# Patient Record
Sex: Female | Born: 1970 | Race: White | Hispanic: No | Marital: Married | Smoking: Never smoker
Health system: Southern US, Community
[De-identification: ages and names within clinical notes are randomized; demographics above are authoritative.]

## PROBLEM LIST (undated history)

## (undated) DIAGNOSIS — Z98891 History of uterine scar from previous surgery: Secondary | ICD-10-CM

## (undated) DIAGNOSIS — C801 Malignant (primary) neoplasm, unspecified: Secondary | ICD-10-CM

## (undated) DIAGNOSIS — Z789 Other specified health status: Secondary | ICD-10-CM

---

## 2003-11-03 ENCOUNTER — Ambulatory Visit (HOSPITAL_COMMUNITY): Admission: RE | Admit: 2003-11-03 | Discharge: 2003-11-03 | Payer: Self-pay | Admitting: Obstetrics and Gynecology

## 2004-03-30 ENCOUNTER — Ambulatory Visit (HOSPITAL_COMMUNITY): Admission: RE | Admit: 2004-03-30 | Discharge: 2004-03-30 | Payer: Self-pay | Admitting: Obstetrics and Gynecology

## 2004-04-18 ENCOUNTER — Ambulatory Visit (HOSPITAL_COMMUNITY): Admission: RE | Admit: 2004-04-18 | Discharge: 2004-04-18 | Payer: Self-pay | Admitting: Obstetrics and Gynecology

## 2004-04-18 ENCOUNTER — Encounter (INDEPENDENT_AMBULATORY_CARE_PROVIDER_SITE_OTHER): Payer: Self-pay | Admitting: Specialist

## 2007-01-02 ENCOUNTER — Inpatient Hospital Stay (HOSPITAL_COMMUNITY): Admission: AD | Admit: 2007-01-02 | Discharge: 2007-01-05 | Payer: Self-pay | Admitting: Obstetrics and Gynecology

## 2010-03-18 ENCOUNTER — Encounter
Admission: RE | Admit: 2010-03-18 | Discharge: 2010-03-18 | Payer: Self-pay | Source: Home / Self Care | Attending: Obstetrics and Gynecology | Admitting: Obstetrics and Gynecology

## 2010-04-23 ENCOUNTER — Encounter: Payer: Self-pay | Admitting: Obstetrics and Gynecology

## 2010-04-24 ENCOUNTER — Encounter: Payer: Self-pay | Admitting: Obstetrics and Gynecology

## 2010-08-16 NOTE — Op Note (Signed)
Laura [name], Laura [name]                  ACCOUNT NO.:  [id number]   MEDICAL RECORD NO.:  [id number]          PATIENT TYPE:  INP   LOCATION:  9103                          FACILITY:  [facility]   PHYSICIAN:  Lenoard Aden, M.D.DATE OF BIRTH:  12-Jan-1971   DATE OF PROCEDURE:  01/02/2007  DATE OF DISCHARGE:                               OPERATIVE REPORT   PREOPERATIVE DIAGNOSIS:  Homero Fellers breech in active labor at 36 weeks.   POSTOPERATIVE DIAGNOSIS:  Homero Fellers breech in active labor at 36 weeks.   PROCEDURE:  Primary, low segment, transverse Cesarean section.   SURGEON:  Olivia Mackie, MD.   ANESTHESIA:  Spinal by Cline Crock, MD.   ASSISTANT:  Marlinda Mike, CNM.   ESTIMATED BLOOD LOSS:  Seven hundred mL.   COMPLICATIONS:  None.   DRAINS:  Foley.   COUNTS:  Correct.   DISPOSITION:  Patient taken to recovery in good condition.   PROCEDURE IN DETAIL:  Having been apprised of the risks of anesthesia,  infection, bleeding, injury to abdominal organs, need for repair and  delivery risk, immediate complications to include bowel and bladder  injury, the patient was brought to the operating room, anesthesia  administered the spinal anesthetic without complications.  Prepped and  draped in the usual sterile fashion.  A Foley catheter was placed.   After achieving adequate anesthesia, diluted Marcaine solution was  placed, a Pfannenstiel skin incision was made with a scalpel and carried  down to the fascia where she was nicked in the midline and opened  transversely with the Mayo scissors.  Rectus muscles dissected sharply  in the midline, peritoneum entered sharply, bladder blade placed.  The  distal peritoneum was cleared sharply off the lower uterine segment.  Curved hysterotomy incision made.  Atraumatic delivery from a frank  breech position using usual maneuvers and flexion of the fetal vertex.  Handed to pediatricians in attendance full-term living female, Apgars 8  and 9.  Cord blood  collected and delivered mainly intact 3-vessel cord  noted. Uterus exteriorized, curetted using lap pad.  Normal uterine  anatomy noted, normal tubes, normal ovaries.  No evidence of septum or  bicornuate uterus.   Uterus then closed in 2 running imbricating layers using 0 Monocryl  suture.  Bladder flap inspected, found to be hemostatic.  Irrigation  accomplished.  Good hemostasis noted.  Fascia closed using a 0 Monocryl  pursestring fashion.  Skin closed using staples.   The patient tolerated this procedure well and was transferred to  recovery in good condition.      Lenoard Aden, M.D.  Electronically Signed     RJT/MEDQ  D:  01/02/2007  T:  01/03/2007  Job:  [id number]

## 2010-08-19 NOTE — Op Note (Signed)
NAMEMarland Kitchen  Laura [name], Laura [name]                  ACCOUNT NO.:  [id number]   MEDICAL RECORD NO.:  [id number]          PATIENT TYPE:  AMB   LOCATION:  SDC                           [facility]ILITY:  [facility]   PHYSICIAN:  Lenoard Aden, M.D.DATE OF BIRTH:  31-Aug-1970   DATE OF PROCEDURE:  04/18/2004  DATE OF DISCHARGE:                                 OPERATIVE REPORT   PREOPERATIVE DIAGNOSIS:  Missed abortion at 11 weeks.   POSTOPERATIVE DIAGNOSIS:  Missed abortion at 11 weeks.   PROCEDURE:  Suction D&E.   SURGEON:  Lenoard Aden, M.D.   ANESTHESIA:  General anesthesia.   ESTIMATED BLOOD LOSS:  Less than 50 mL.   COMPLICATIONS:  None.   Products of conception to pathology.  The patient taken to recovery in good  condition.   DESCRIPTION OF PROCEDURE:  After being apprised of risks of anesthesia,  infection, bleeding, injury to abdominal organs with need for repair,  delayed risk and immediate complications to include bowel and bladder  injury,  the patient is brought to the operating room where she is  administered general anesthetic without complications, prepped and draped in  usual fashion, catheterized until the bladder was empty.  Examination under  anesthesia reveals a six-week anteflexed uterus.  No adnexal masses.  Uterus  sounds to 8 cm, dilated up to a #21 Pratt dilator.  A 7 mm curved suction  curette placed.  Suction initiated.  Products of conception noted.  Blunt  curettage in a four quadrant method reveals cavity to be empty.  Repeat  suction curettage confirms.  Minimal bleeding noted.  All instruments  removed.  The patient tolerated the procedure well and is returned to  recovery in good condition.     Rich   RJT/MEDQ  D:  04/18/2004  T:  04/18/2004  Job:  [id number]   cc:   Floyde Parkins

## 2010-08-19 NOTE — Discharge Summary (Signed)
[name], [name]                  ACCOUNT NO.:  [id number]   MEDICAL RECORD NO.:  [id number]          PATIENT TYPE:  INP   LOCATION:  9103                          [facility]ILITY:  [facility]   PHYSICIAN:  Lenoard Aden, M.D.DATE OF BIRTH:  1971/03/07   DATE OF ADMISSION:  01/02/2007  DATE OF DISCHARGE:  01/05/2007                               DISCHARGE SUMMARY   The patient underwent an uncomplicated primary C section for breech and  active labor. Postoperative course uncomplicated. Tolerated it well.  Discharged to home day 3. Discharge teaching done. Tylox and prenatal  vitamins given. Followup in the office in 4-6 weeks.      Lenoard Aden, M.D.  Electronically Signed     RJT/MEDQ  D:  01/30/2007  T:  01/30/2007  Job:  [id number]

## 2011-01-12 LAB — CBC
HCT: 28.7 — ABNORMAL LOW
HCT: 32.7 — ABNORMAL LOW
Hemoglobin: 11.3 — ABNORMAL LOW
Hemoglobin: 9.7 — ABNORMAL LOW
MCHC: 34
MCHC: 34.5
MCV: 80
MCV: 81.4
Platelets: 290
Platelets: 335
RBC: 3.53 — ABNORMAL LOW
RBC: 4.09
RDW: 14.3 — ABNORMAL HIGH
RDW: 14.4 — ABNORMAL HIGH
WBC: 10.4
WBC: 14.2 — ABNORMAL HIGH

## 2011-01-12 LAB — SYPHILIS: RPR W/REFLEX TO RPR TITER AND TREPONEMAL ANTIBODIES, TRADITIONAL SCREENING AND DIAGNOSIS ALGORITHM: RPR Ser Ql: NONREACTIVE

## 2011-03-21 ENCOUNTER — Other Ambulatory Visit: Payer: Self-pay | Admitting: Obstetrics and Gynecology

## 2011-03-21 DIAGNOSIS — Z1231 Encounter for screening mammogram for malignant neoplasm of breast: Secondary | ICD-10-CM

## 2011-04-24 ENCOUNTER — Ambulatory Visit
Admission: RE | Admit: 2011-04-24 | Discharge: 2011-04-24 | Disposition: A | Discharge status: Home / Self Care | Payer: BC Managed Care – PPO | Source: Ambulatory Visit | Attending: Obstetrics and Gynecology | Admitting: Obstetrics and Gynecology

## 2011-04-24 DIAGNOSIS — Z1231 Encounter for screening mammogram for malignant neoplasm of breast: Secondary | ICD-10-CM

## 2012-12-05 ENCOUNTER — Other Ambulatory Visit: Payer: Self-pay

## 2012-12-17 ENCOUNTER — Encounter (HOSPITAL_COMMUNITY): Payer: Self-pay | Admitting: Obstetrics and Gynecology

## 2012-12-24 ENCOUNTER — Encounter (HOSPITAL_COMMUNITY): Payer: Self-pay

## 2012-12-24 ENCOUNTER — Ambulatory Visit (HOSPITAL_COMMUNITY)
Admission: RE | Admit: 2012-12-24 | Discharge: 2012-12-24 | Disposition: A | Discharge status: Home / Self Care | Payer: BC Managed Care – PPO | Source: Ambulatory Visit | Attending: Obstetrics and Gynecology | Admitting: Obstetrics and Gynecology

## 2012-12-24 DIAGNOSIS — O351XX0 Maternal care for (suspected) chromosomal abnormality in fetus, not applicable or unspecified: Secondary | ICD-10-CM | POA: Insufficient documentation

## 2012-12-24 NOTE — Progress Notes (Signed)
Genetic Counseling  High-Risk Gestation Note  Appointment Date:  12/24/2012 Referred By: Lenoard Aden, MD Date of Birth:  [birth year] Partner:  Laura [name]    Pregnancy History: [id number] Estimated Date of Delivery: 06/14/13 Estimated Gestational Age: [redacted]w[redacted]d Attending: Alpha Gula, MD   [name] and her husband, Mr. Laura [name], were seen for genetic counseling because of an increased risk for fetal trisomy X syndrome from noninvasive prenatal screening (NIPS)/cell free DNA testing from [place] [facility]) facilitated through her OB office.  The results of the noninvasive prenatal screening (NIPS) revealed a >99% chance of fetal 59, XXX (also known as triple X syndrome or trisomy X syndrome). We discussed that this testing is reported to have a false positive rate of less than 1% for sex chromosome aneuploidy. In addition, we reviewed that NIPS indicated a low risk for trisomy 60, trisomy 40, and trisomy 13 (less than 1 in 10,000 for each). This testing identifies > 99% of pregnancies with trisomy 21, >98% of pregnancies with trisomy 17, and >80% with trisomy 59. We reviewed that this testing is highly sensitive and specific, but is not considered diagnostic. We reviewed this tests limitations and emphasized its utility as a screening test only.  We reviewed that the cell free DNA test can not distinguish between aneuploidy confined to the placenta, trisomy X in the fetus, mosaic trisomy X, or an underlying maternal X chromosome aneuploidy.   They were counseled regarding maternal age and the association with risk for chromosome conditions due to nondisjunction with aging of the ova.   We reviewed chromosomes and nondisjunction. Typically, humans have 46 chromosomes in 23 pairs. The last pair, the 23rd pair, is also referred to as the sex chromosomes. Males typically have one X and one Y chromosome, and females typically have two X chromosomes. Trisomy X, or 82, XXX, describes a female  with an additional X chromosome present.   We discussed that most females with 23, XXX will have some but not all possible features of the conditions. Physically, we discussed that females with Trisomy X syndrome may be taller than average, though this is not always the case. Subtle physical differences may also include epicanthal folds and/or clinodactyly.  Typically, trisomy X syndrome is not typically associated with major congenital malformations. However, there are reports of females with 99, XXX with birth defects, with genitourinary malformations among the most common type that occur (approximately 5-16% of cases). We discussed the increased chance for hypotonia, possibly resulting in developmental delay. Most individuals with trisomy X syndrome have intelligence in the normal or low-normal range, but there is an increased chance for learning disabilities, particularly related to speech and language skills.  An increased chance for psychiatric/behavioral disorders, epilepsy, and possibly premature ovarian failure has also been described for a subset of patients with triple X syndrome. [name] was counseled that Trisomy X has an approximate incidence of 1 in 1,000. However, there is a high degree of variability seen among females who have this condition, meaning that every child with 19, XXX will not be affected in exactly the same way, and some children will have more or less features than others. It is estimated that only 10% of individuals with Trisomy X are diagnosed given in part to many individuals being mildly affected or asymptomatic.   We discussed that due to the higher chance for developmental delays, early intervention programs may be helpful for children with Trisomy X syndrome. Early intervention services such  as physical, occupational, and speech therapies can help with achievement of developmental milestones. These therapies are typically provided to children (with qualifying  diagnoses) from birth until age 53.  Given the variability of the symptoms associated with Trisomy X syndrome, specific outcomes for each individual cannot be predicted. We discussed national support organizations and provided the couple with written information regarding chromosomes and Trisomy X. Additionally, we discussed that postnatal health management can be coordinated by a medical geneticist, in the case that 88, XXX is diagnosed. [name] was encouraged to contact us if they have any additional questions/concerns or if they need additional supportive or informational recommendations.   We discussed the availability of amniocentesis or postnatal peripheral blood chromosome analysis for confirmation of the diagnosis. We discussed the risks, benefits, and limitations of amniocentesis, including the approximate 1 in 300-500 risk of complications, including spontaneous pregnancy loss. We also discussed the option of targeted ultrasound in the second trimester to assess fetal growth and anatomy in detail.  After careful consideration, [name] declined amniocentesis in the pregnancy, given the associated risk of complications in pregnancy. They also indicated that they would not plan to alter the course of their pregnancy based on the presence of 51, XXX. The couple stated that they would prefer to pursue postnatal chromosome studies to assess for the presence or absence of Trisomy X.  [name] stated that she will likely plan to have targeted ultrasound through her primary OB office. If the couple and/or her OB desire for this to be performed at our office, please contact our office to facilitate. They understand that targeted ultrasound and screening do not diagnose or rule out all birth defects or genetic conditions.   [name] was provided with written information regarding cystic fibrosis (CF) including the carrier frequency and incidence in the Caucasian population, the  availability of carrier testing and prenatal diagnosis if indicated.  In addition, we discussed that CF is routinely screened for as part of the [facility] newborn screening panel.  She declined testing today.   Both family histories were reviewed and found to be noncontributory for birth defects, intellectual disability, and known genetic conditions. Without further information regarding the provided family history, an accurate genetic risk cannot be calculated. Further genetic counseling is warranted if more information is obtained. The father of the pregnancy reported that he is 43 years old. This couple was counseled that advanced paternal age (APA) is defined as paternal age greater than or equal to age 75.  Recent large-scale sequencing studies have shown that approximately 80% of de novo point mutations are of paternal origin.  Many studies have demonstrated a strong correlation between increased paternal age and de novo point mutations.  Although no specific data is available regarding fetal risks for fathers 74+ years old at conception, it is apparent that the overall risk for single gene conditions is increased.  To estimate the relative increase in risk of a genetic disorder with APA, the heritability of the disease must be considered.  Assuming an approximate 2x increase in risk for conditions that are exclusively paternal in origin, the risk for each individual condition is still relatively low.  It is estimated that the overall chance for a de novo mutation is ~0.5%.  We also discussed the wide range of conditions which can be caused by new dominant gene mutations (achondroplasia, neurofibromatosis, Marfan syndrome etc.).  They were counseled that genetic testing for each individual single gene condition  is not warranted or available unless ultrasound or family history concerns lend suspicion to a specific condition. We reviewed the recommendation for a detailed ultrasound at approximately 18+ weeks  gestation and a follow up ultrasound at ~28 weeks to monitor fetal growth.  [name] denied exposure to environmental toxins or chemical agents. She denied the use of alcohol, tobacco or street drugs. She denied significant viral illnesses during the course of her pregnancy. Her medical and surgical histories were noncontributory.   I counseled this couple regarding the above risks and available options.  The approximate face-to-face time with the genetic counselor was 40 minutes.    Quinn Plowman, MS Certified Genetic Counselor 12/24/2012

## 2013-05-14 ENCOUNTER — Other Ambulatory Visit: Payer: Self-pay | Admitting: Obstetrics and Gynecology

## 2013-05-20 ENCOUNTER — Inpatient Hospital Stay (HOSPITAL_COMMUNITY)
Admission: AD | Admit: 2013-05-20 | Discharge: 2013-05-23 | DRG: 765 | Disposition: A | Discharge status: Home / Self Care | Payer: BC Managed Care – PPO | Source: Ambulatory Visit | Attending: Obstetrics and Gynecology | Admitting: Obstetrics and Gynecology

## 2013-05-20 ENCOUNTER — Inpatient Hospital Stay (HOSPITAL_COMMUNITY): Payer: BC Managed Care – PPO | Admitting: Anesthesiology

## 2013-05-20 ENCOUNTER — Encounter (HOSPITAL_COMMUNITY): Payer: Self-pay

## 2013-05-20 ENCOUNTER — Encounter (HOSPITAL_COMMUNITY)
Admission: AD | Disposition: A | Discharge status: Home / Self Care | Payer: Self-pay | Source: Ambulatory Visit | Attending: Obstetrics and Gynecology

## 2013-05-20 ENCOUNTER — Encounter (HOSPITAL_COMMUNITY): Payer: BC Managed Care – PPO | Admitting: Anesthesiology

## 2013-05-20 DIAGNOSIS — Z6839 Body mass index (BMI) 39.0-39.9, adult: Secondary | ICD-10-CM

## 2013-05-20 DIAGNOSIS — D62 Acute posthemorrhagic anemia: Secondary | ICD-10-CM | POA: Diagnosis not present

## 2013-05-20 DIAGNOSIS — O99214 Obesity complicating childbirth: Secondary | ICD-10-CM

## 2013-05-20 DIAGNOSIS — O34219 Maternal care for unspecified type scar from previous cesarean delivery: Principal | ICD-10-CM | POA: Diagnosis present

## 2013-05-20 DIAGNOSIS — O09529 Supervision of elderly multigravida, unspecified trimester: Secondary | ICD-10-CM | POA: Diagnosis present

## 2013-05-20 DIAGNOSIS — O351XX0 Maternal care for (suspected) chromosomal abnormality in fetus, not applicable or unspecified: Secondary | ICD-10-CM

## 2013-05-20 DIAGNOSIS — O9903 Anemia complicating the puerperium: Secondary | ICD-10-CM | POA: Diagnosis not present

## 2013-05-20 DIAGNOSIS — O3510X0 Maternal care for (suspected) chromosomal abnormality in fetus, unspecified, not applicable or unspecified: Secondary | ICD-10-CM

## 2013-05-20 DIAGNOSIS — E669 Obesity, unspecified: Secondary | ICD-10-CM | POA: Diagnosis present

## 2013-05-20 DIAGNOSIS — Z98891 History of uterine scar from previous surgery: Secondary | ICD-10-CM

## 2013-05-20 HISTORY — DX: History of uterine scar from previous surgery: Z98.891

## 2013-05-20 HISTORY — DX: Other specified health status: Z78.9

## 2013-05-20 LAB — ABO/RH: ABO/RH(D): O POS

## 2013-05-20 LAB — CBC
HCT: 32.2 % — ABNORMAL LOW (ref 36.0–46.0)
HEMOGLOBIN: 10.6 g/dL — AB (ref 12.0–15.0)
MCH: 26.7 pg (ref 26.0–34.0)
MCHC: 32.9 g/dL (ref 30.0–36.0)
MCV: 81.1 fL (ref 78.0–100.0)
Platelets: 287 10*3/uL (ref 150–400)
RBC: 3.97 MIL/uL (ref 3.87–5.11)
RDW: 14.5 % (ref 11.5–15.5)
WBC: 9.8 10*3/uL (ref 4.0–10.5)

## 2013-05-20 LAB — RPR: RPR Ser Ql: NONREACTIVE

## 2013-05-20 LAB — TYPE AND SCREEN
ABO/RH(D): O POS
Antibody Screen: NEGATIVE

## 2013-05-20 LAB — POCT FERN TEST: POCT Fern Test: POSITIVE

## 2013-05-20 SURGERY — Surgical Case
Anesthesia: Spinal | Site: Abdomen

## 2013-05-20 MED ORDER — SIMETHICONE 80 MG PO CHEW
80.0000 mg | CHEWABLE_TABLET | ORAL | Status: DC
  Administered 2013-05-21 – 2013-05-22 (×3): 80 mg via ORAL
  Filled 2013-05-20 (×3): qty 1

## 2013-05-20 MED ORDER — DIBUCAINE 1 % RE OINT: 1.0000 "application " | TOPICAL_OINTMENT | RECTAL | Status: DC | PRN

## 2013-05-20 MED ORDER — DIPHENHYDRAMINE HCL 50 MG/ML IJ SOLN: 25.0000 mg | INTRAMUSCULAR | Status: DC | PRN

## 2013-05-20 MED ORDER — EPHEDRINE 5 MG/ML INJ
INTRAVENOUS | Status: AC
  Filled 2013-05-20: qty 10

## 2013-05-20 MED ORDER — WITCH HAZEL-GLYCERIN EX PADS: 1.0000 "application " | MEDICATED_PAD | CUTANEOUS | Status: DC | PRN

## 2013-05-20 MED ORDER — KETOROLAC TROMETHAMINE 30 MG/ML IJ SOLN
30.0000 mg | Freq: Four times a day (QID) | INTRAMUSCULAR | Status: AC | PRN
Start: 2013-05-20 — End: 2013-05-21

## 2013-05-20 MED ORDER — MORPHINE SULFATE (PF) 0.5 MG/ML IJ SOLN
INTRAMUSCULAR | Status: DC | PRN
  Administered 2013-05-20: .1 mg via INTRATHECAL

## 2013-05-20 MED ORDER — LACTATED RINGERS IV SOLN
INTRAVENOUS | Status: DC
  Administered 2013-05-20: 12:00:00 via INTRAVENOUS

## 2013-05-20 MED ORDER — OXYCODONE-ACETAMINOPHEN 5-325 MG PO TABS
1.0000 | ORAL_TABLET | ORAL | Status: DC | PRN
  Administered 2013-05-21 – 2013-05-22 (×3): 1 via ORAL
  Filled 2013-05-20 (×4): qty 1

## 2013-05-20 MED ORDER — OXYTOCIN 40 UNITS IN LACTATED RINGERS INFUSION - SIMPLE MED: 62.5000 mL/h | INTRAVENOUS | Status: AC

## 2013-05-20 MED ORDER — SCOPOLAMINE 1 MG/3DAYS TD PT72
1.0000 | MEDICATED_PATCH | Freq: Once | TRANSDERMAL | Status: AC
  Administered 2013-05-20: 1.5 mg via TRANSDERMAL

## 2013-05-20 MED ORDER — METOCLOPRAMIDE HCL 5 MG/ML IJ SOLN: 10.0000 mg | Freq: Once | INTRAMUSCULAR | Status: DC | PRN

## 2013-05-20 MED ORDER — SENNOSIDES-DOCUSATE SODIUM 8.6-50 MG PO TABS
2.0000 | ORAL_TABLET | ORAL | Status: DC
  Administered 2013-05-21 – 2013-05-22 (×2): 2 via ORAL
  Filled 2013-05-20 (×3): qty 2

## 2013-05-20 MED ORDER — MENTHOL 3 MG MT LOZG: 1.0000 | LOZENGE | OROMUCOSAL | Status: DC | PRN

## 2013-05-20 MED ORDER — NALOXONE HCL 1 MG/ML IJ SOLN
1.0000 ug/kg/h | INTRAMUSCULAR | Status: DC | PRN
  Filled 2013-05-20: qty 2

## 2013-05-20 MED ORDER — FENTANYL CITRATE 0.05 MG/ML IJ SOLN
INTRAMUSCULAR | Status: AC
  Filled 2013-05-20: qty 2

## 2013-05-20 MED ORDER — ONDANSETRON HCL 4 MG PO TABS: 4.0000 mg | ORAL_TABLET | ORAL | Status: DC | PRN

## 2013-05-20 MED ORDER — MEPERIDINE HCL 25 MG/ML IJ SOLN: 6.2500 mg | INTRAMUSCULAR | Status: DC | PRN

## 2013-05-20 MED ORDER — BUPIVACAINE HCL (PF) 0.25 % IJ SOLN
INTRAMUSCULAR | Status: DC | PRN
  Administered 2013-05-20: 10 mL

## 2013-05-20 MED ORDER — METOCLOPRAMIDE HCL 5 MG/ML IJ SOLN: 10.0000 mg | Freq: Three times a day (TID) | INTRAMUSCULAR | Status: DC | PRN

## 2013-05-20 MED ORDER — FAMOTIDINE IN NACL 20-0.9 MG/50ML-% IV SOLN
20.0000 mg | Freq: Once | INTRAVENOUS | Status: AC
  Administered 2013-05-20: 20 mg via INTRAVENOUS
  Filled 2013-05-20: qty 50

## 2013-05-20 MED ORDER — PHENYLEPHRINE 8 MG IN D5W 100 ML (0.08MG/ML) PREMIX OPTIME
INJECTION | INTRAVENOUS | Status: DC | PRN
  Administered 2013-05-20: 60 ug/min via INTRAVENOUS

## 2013-05-20 MED ORDER — SIMETHICONE 80 MG PO CHEW: 80.0000 mg | CHEWABLE_TABLET | ORAL | Status: DC | PRN

## 2013-05-20 MED ORDER — SIMETHICONE 80 MG PO CHEW
80.0000 mg | CHEWABLE_TABLET | Freq: Three times a day (TID) | ORAL | Status: DC
Start: 2013-05-20 — End: 2013-05-23
  Administered 2013-05-20 – 2013-05-23 (×9): 80 mg via ORAL
  Filled 2013-05-20 (×10): qty 1

## 2013-05-20 MED ORDER — TETANUS-DIPHTH-ACELL PERTUSSIS 5-2.5-18.5 LF-MCG/0.5 IM SUSP: 0.5000 mL | Freq: Once | INTRAMUSCULAR | Status: DC

## 2013-05-20 MED ORDER — CEFAZOLIN SODIUM-DEXTROSE 2-3 GM-% IV SOLR
INTRAVENOUS | Status: AC
  Filled 2013-05-20: qty 50

## 2013-05-20 MED ORDER — ONDANSETRON HCL 4 MG/2ML IJ SOLN: 4.0000 mg | INTRAMUSCULAR | Status: DC | PRN

## 2013-05-20 MED ORDER — NALOXONE HCL 0.4 MG/ML IJ SOLN: 0.4000 mg | INTRAMUSCULAR | Status: DC | PRN

## 2013-05-20 MED ORDER — PHENYLEPHRINE 8 MG IN D5W 100 ML (0.08MG/ML) PREMIX OPTIME
INJECTION | INTRAVENOUS | Status: AC
  Filled 2013-05-20: qty 100

## 2013-05-20 MED ORDER — BUPIVACAINE IN DEXTROSE 0.75-8.25 % IT SOLN
INTRATHECAL | Status: DC | PRN
  Administered 2013-05-20: 1.3 mL via INTRATHECAL

## 2013-05-20 MED ORDER — SCOPOLAMINE 1 MG/3DAYS TD PT72
MEDICATED_PATCH | TRANSDERMAL | Status: AC
  Filled 2013-05-20: qty 1

## 2013-05-20 MED ORDER — METHYLERGONOVINE MALEATE 0.2 MG PO TABS: 0.2000 mg | ORAL_TABLET | ORAL | Status: DC | PRN

## 2013-05-20 MED ORDER — OXYTOCIN 10 UNIT/ML IJ SOLN
40.0000 [IU] | INTRAVENOUS | Status: DC | PRN
  Administered 2013-05-20: 40 [IU] via INTRAVENOUS

## 2013-05-20 MED ORDER — KETOROLAC TROMETHAMINE 30 MG/ML IJ SOLN: 30.0000 mg | Freq: Four times a day (QID) | INTRAMUSCULAR | Status: AC | PRN

## 2013-05-20 MED ORDER — ONDANSETRON HCL 4 MG/2ML IJ SOLN: 4.0000 mg | Freq: Three times a day (TID) | INTRAMUSCULAR | Status: DC | PRN

## 2013-05-20 MED ORDER — FENTANYL CITRATE 0.05 MG/ML IJ SOLN: 25.0000 ug | INTRAMUSCULAR | Status: DC | PRN

## 2013-05-20 MED ORDER — ONDANSETRON HCL 4 MG/2ML IJ SOLN
INTRAMUSCULAR | Status: DC | PRN
  Administered 2013-05-20: 4 mg via INTRAVENOUS

## 2013-05-20 MED ORDER — NALBUPHINE HCL 10 MG/ML IJ SOLN: 5.0000 mg | INTRAMUSCULAR | Status: DC | PRN

## 2013-05-20 MED ORDER — KETOROLAC TROMETHAMINE 60 MG/2ML IM SOLN
INTRAMUSCULAR | Status: AC
  Filled 2013-05-20: qty 2

## 2013-05-20 MED ORDER — MORPHINE SULFATE 0.5 MG/ML IJ SOLN
INTRAMUSCULAR | Status: AC
  Filled 2013-05-20: qty 10

## 2013-05-20 MED ORDER — FENTANYL CITRATE 0.05 MG/ML IJ SOLN
INTRAMUSCULAR | Status: DC | PRN
  Administered 2013-05-20: 15 ug via INTRATHECAL

## 2013-05-20 MED ORDER — LANOLIN HYDROUS EX OINT: 1.0000 "application " | TOPICAL_OINTMENT | CUTANEOUS | Status: DC | PRN

## 2013-05-20 MED ORDER — METHYLERGONOVINE MALEATE 0.2 MG/ML IJ SOLN: 0.2000 mg | INTRAMUSCULAR | Status: DC | PRN

## 2013-05-20 MED ORDER — SODIUM CHLORIDE 0.9 % IJ SOLN: 3.0000 mL | INTRAMUSCULAR | Status: DC | PRN

## 2013-05-20 MED ORDER — PRENATAL MULTIVITAMIN CH
1.0000 | ORAL_TABLET | Freq: Every day | ORAL | Status: DC
  Administered 2013-05-21 – 2013-05-23 (×3): 1 via ORAL
  Filled 2013-05-20 (×3): qty 1

## 2013-05-20 MED ORDER — DIPHENHYDRAMINE HCL 25 MG PO CAPS: 25.0000 mg | ORAL_CAPSULE | ORAL | Status: DC | PRN

## 2013-05-20 MED ORDER — DIPHENHYDRAMINE HCL 25 MG PO CAPS: 25.0000 mg | ORAL_CAPSULE | Freq: Four times a day (QID) | ORAL | Status: DC | PRN

## 2013-05-20 MED ORDER — CEFAZOLIN SODIUM-DEXTROSE 2-3 GM-% IV SOLR
2.0000 g | Freq: Three times a day (TID) | INTRAVENOUS | Status: DC
  Administered 2013-05-20: 2 g via INTRAVENOUS

## 2013-05-20 MED ORDER — LACTATED RINGERS IV SOLN
INTRAVENOUS | Status: DC | PRN
  Administered 2013-05-20 (×2): via INTRAVENOUS

## 2013-05-20 MED ORDER — LACTATED RINGERS IV SOLN
INTRAVENOUS | Status: DC | PRN
  Administered 2013-05-20 (×3): via INTRAVENOUS

## 2013-05-20 MED ORDER — KETOROLAC TROMETHAMINE 60 MG/2ML IM SOLN
60.0000 mg | Freq: Once | INTRAMUSCULAR | Status: AC | PRN
  Administered 2013-05-20: 60 mg via INTRAMUSCULAR

## 2013-05-20 MED ORDER — OXYTOCIN 10 UNIT/ML IJ SOLN
INTRAMUSCULAR | Status: AC
  Filled 2013-05-20: qty 4

## 2013-05-20 MED ORDER — CITRIC ACID-SODIUM CITRATE 334-500 MG/5ML PO SOLN
30.0000 mL | Freq: Once | ORAL | Status: AC
  Administered 2013-05-20: 30 mL via ORAL
  Filled 2013-05-20: qty 15

## 2013-05-20 MED ORDER — ONDANSETRON HCL 4 MG/2ML IJ SOLN
INTRAMUSCULAR | Status: AC
  Filled 2013-05-20: qty 2

## 2013-05-20 MED ORDER — IBUPROFEN 600 MG PO TABS
600.0000 mg | ORAL_TABLET | Freq: Four times a day (QID) | ORAL | Status: DC
  Administered 2013-05-20 – 2013-05-23 (×13): 600 mg via ORAL
  Filled 2013-05-20 (×13): qty 1

## 2013-05-20 MED ORDER — ZOLPIDEM TARTRATE 5 MG PO TABS: 5.0000 mg | ORAL_TABLET | Freq: Every evening | ORAL | Status: DC | PRN

## 2013-05-20 MED ORDER — BUPIVACAINE HCL (PF) 0.25 % IJ SOLN
INTRAMUSCULAR | Status: AC
  Filled 2013-05-20: qty 30

## 2013-05-20 MED ORDER — DIPHENHYDRAMINE HCL 50 MG/ML IJ SOLN: 12.5000 mg | INTRAMUSCULAR | Status: DC | PRN

## 2013-05-20 SURGICAL SUPPLY — 34 items
CLAMP CORD UMBIL (MISCELLANEOUS) IMPLANT
CLOTH BEACON ORANGE TIMEOUT ST (SAFETY) ×3 IMPLANT
CONTAINER PREFILL 10% NBF 15ML (MISCELLANEOUS) IMPLANT
DRAPE LG THREE QUARTER DISP (DRAPES) IMPLANT
DRSG OPSITE POSTOP 4X10 (GAUZE/BANDAGES/DRESSINGS) ×3 IMPLANT
DURAPREP 26ML APPLICATOR (WOUND CARE) ×3 IMPLANT
ELECT REM PT RETURN 9FT ADLT (ELECTROSURGICAL) ×3
ELECTRODE REM PT RTRN 9FT ADLT (ELECTROSURGICAL) ×1 IMPLANT
EXTRACTOR VACUUM M CUP 4 TUBE (SUCTIONS) IMPLANT
EXTRACTOR VACUUM M CUP 4' TUBE (SUCTIONS)
GLOVE BIO SURGEON STRL SZ7.5 (GLOVE) ×3 IMPLANT
GOWN PREVENTION PLUS XLARGE (GOWN DISPOSABLE) ×3 IMPLANT
GOWN STRL REIN XL XLG (GOWN DISPOSABLE) ×3 IMPLANT
KIT ABG SYR 3ML LUER SLIP (SYRINGE) IMPLANT
NDL HYPO 25X1 1.5 SAFETY (NEEDLE) ×1 IMPLANT
NDL HYPO 25X5/8 SAFETYGLIDE (NEEDLE) IMPLANT
NEEDLE HYPO 25X1 1.5 SAFETY (NEEDLE) ×3 IMPLANT
NEEDLE HYPO 25X5/8 SAFETYGLIDE (NEEDLE) IMPLANT
NS IRRIG 1000ML POUR BTL (IV SOLUTION) ×3 IMPLANT
PACK C SECTION WH (CUSTOM PROCEDURE TRAY) ×3 IMPLANT
STAPLER VISISTAT 35W (STAPLE) IMPLANT
SUT MNCRL 0 VIOLET CTX 36 (SUTURE) ×2 IMPLANT
SUT MNCRL AB 3-0 PS2 27 (SUTURE) IMPLANT
SUT MON AB 2-0 CT1 27 (SUTURE) ×3 IMPLANT
SUT MON AB-0 CT1 36 (SUTURE) ×6 IMPLANT
SUT MONOCRYL 0 CTX 36 (SUTURE) ×4
SUT PLAIN 0 NONE (SUTURE) IMPLANT
SUT PLAIN 2 0 (SUTURE)
SUT PLAIN 2 0 XLH (SUTURE) IMPLANT
SUT PLAIN ABS 2-0 CT1 27XMFL (SUTURE) IMPLANT
SYR CONTROL 10ML LL (SYRINGE) ×3 IMPLANT
TOWEL OR 17X24 6PK STRL BLUE (TOWEL DISPOSABLE) ×3 IMPLANT
TRAY FOLEY CATH 14FR (SET/KITS/TRAYS/PACK) ×3 IMPLANT
WATER STERILE IRR 1000ML POUR (IV SOLUTION) ×3 IMPLANT

## 2013-05-20 NOTE — Progress Notes (Signed)
Patient ID: Laura [name], female   DOB: 11/25/1970, 43 y.o.   MRN: [id number] Patient seen and examined. Consent witnessed and signed. No changes noted. Update completed. CBC    Component Value Date/Time   WBC 9.8 05/20/2013 0415   RBC 3.97 05/20/2013 0415   HGB 10.6* 05/20/2013 0415   HCT 32.2* 05/20/2013 0415   PLT 287 05/20/2013 0415   MCV 81.1 05/20/2013 0415   MCH 26.7 05/20/2013 0415   MCHC 32.9 05/20/2013 0415   RDW 14.5 05/20/2013 0415

## 2013-05-20 NOTE — H&P (Signed)
Laura [name] is a 43 y.o. female presenting for SROM. Maternal Medical History:  Reason for admission: Rupture of membranes.   Contractions: Onset was less than 1 hour ago.   Frequency: rare.   Perceived severity is mild.    Fetal activity: Perceived fetal activity is normal.   Last perceived fetal movement was within the past hour.    Prenatal complications: no prenatal complications Prenatal Complications - Diabetes: none.    OB History   Grav Para Term Preterm Abortions TAB SAB Ect Mult Living   2 1 1       1      Past Medical History  Diagnosis Date  . Medical history non-contributory    Past Surgical History  Procedure Laterality Date  . Cesarean section     Family History: family history is not on file. Social History:  reports that she has never smoked. She has never used smokeless tobacco. She reports that she does not use illicit drugs. Her alcohol history is not on file.   Prenatal Transfer Tool  Maternal Diabetes: No Genetic Screening: Abnormal:  Results: Other: Maternal Ultrasounds/Referrals: Normal Fetal Ultrasounds or other Referrals:  None Maternal Substance Abuse:  No Significant Maternal Medications:  None Significant Maternal Lab Results:  None Other Comments:  NIPT c/w trisomy X  Review of Systems  All other systems reviewed and are negative.    Dilation: 4 Effacement (%): 90 Station: -2 Exam by:: Olevia Bowens, RN/Danielle Simpson, RN Blood pressure 136/82, pulse 107, temperature 98 F (36.7 C), temperature source Oral, resp. rate 18, height 5\' 1"  (1.549 m), weight 93.532 kg (206 lb 3.2 oz), unknown if currently breastfeeding. Maternal Exam:  Uterine Assessment: Contraction strength is mild.  Contraction frequency is irregular.   Abdomen: Patient reports no abdominal tenderness. Surgical scars: low transverse.   Fetal presentation: vertex  Introitus: Normal vulva. Normal vagina.  Ferning test: positive.  Nitrazine test:  positive. Amniotic fluid character: clear.  Pelvis: adequate for delivery.   Cervix: Cervix evaluated by digital exam.     Physical Exam  Nursing note and vitals reviewed. Constitutional: She is oriented to person, place, and time. She appears well-developed.  HENT:  Head: Normocephalic and atraumatic.  Cardiovascular: Normal rate and regular rhythm.   Respiratory: Effort normal and breath sounds normal.  GI: Soft. Bowel sounds are normal.  Genitourinary: Vagina normal and uterus normal.  Musculoskeletal: Normal range of motion.  Neurological: She is alert and oriented to person, place, and time.  Skin: Skin is warm and dry.    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

## 2013-05-20 NOTE — Transfer of Care (Signed)
Immediate Anesthesia Transfer of Care Note  Patient: Laura [name]  Procedure(s) Performed: Procedure(s): CESAREAN SECTION (N/A)  Patient Location: PACU  Anesthesia Type:Spinal  Level of Consciousness: awake, alert , oriented and patient cooperative  Airway & Oxygen Therapy: Patient Spontanous Breathing  Post-op Assessment: Report given to PACU RN and Post -op Vital signs reviewed and stable  Post vital signs: Reviewed and stable  Complications: No apparent anesthesia complications

## 2013-05-20 NOTE — Anesthesia Postprocedure Evaluation (Signed)
  Anesthesia Post-op Note  Patient: Laura [name]  Procedure(s) Performed: Procedure(s): CESAREAN SECTION (N/A)  Patient Location: PACU  Anesthesia Type:Spinal  Level of Consciousness: awake, alert  and oriented  Airway and Oxygen Therapy: Patient Spontanous Breathing  Post-op Pain: none  Post-op Assessment: Post-op Vital signs reviewed, Patient's Cardiovascular Status Stable, Respiratory Function Stable, Patent Airway, No signs of Nausea or vomiting and Pain level controlled  Post-op Vital Signs: Reviewed and stable  Complications: No apparent anesthesia complications

## 2013-05-20 NOTE — MAU Note (Addendum)
Dr. Ronita Hipps notified of patients desire to have a repeat C-section was told to prep patient and he would be a while to come in and see patient. Notify him if patient gets uncomfortable. Notified RN to prep pt for csec and call me if pt became uncomfortable in the interim.

## 2013-05-20 NOTE — Anesthesia Preprocedure Evaluation (Addendum)
Anesthesia Evaluation  Patient identified by MRN, date of birth, ID band Patient awake    Reviewed: Allergy & Precautions, H&P , NPO status , Patient's Chart, lab work & pertinent test results, reviewed documented beta blocker date and time   History of Anesthesia Complications Negative for: history of anesthetic complications  Airway Mallampati: III TM Distance: >3 FB Neck ROM: full    Dental  (+) Teeth Intact   Pulmonary neg pulmonary ROS,  breath sounds clear to auscultation        Cardiovascular negative cardio ROS  Rhythm:regular Rate:Normal     Neuro/Psych negative neurological ROS  negative psych ROS   GI/Hepatic negative GI ROS, Neg liver ROS,   Endo/Other  Obese - BMI 39  Renal/GU negative Renal ROS     Musculoskeletal   Abdominal   Peds  Hematology  (+) anemia ,   Anesthesia Other Findings NPO since 7 pm  Reproductive/Obstetrics (+) Pregnancy (h/o c/s x1, SROM, for repeat C/S  Fetal Chromosomal anomaly (trisomy X))                          Anesthesia Physical Anesthesia Plan  ASA: II and emergent  Anesthesia Plan: Spinal   Post-op Pain Management:    Induction:   Airway Management Planned:   Additional Equipment:   Intra-op Plan:   Post-operative Plan:   Informed Consent: I have reviewed the patients History and Physical, chart, labs and discussed the procedure including the risks, benefits and alternatives for the proposed anesthesia with the patient or authorized representative who has indicated his/her understanding and acceptance.     Plan Discussed with: CRNA and Surgeon  Anesthesia Plan Comments:         Anesthesia Quick Evaluation

## 2013-05-20 NOTE — MAU Note (Signed)
Pt states SROM at 0115. Previous c/s due to breech presentation. Wants repeat c/s. Denies uc.

## 2013-05-20 NOTE — Addendum Note (Signed)
Addendum created 05/20/13 0727 by Venia Carbon. Royce Macadamia, MD   Modules edited: Anesthesia Medication Administration

## 2013-05-20 NOTE — Op Note (Signed)
Cesarean Section Procedure Note  Indications: previous uterine incision kerr x one SROM in labor  Pre-operative Diagnosis: 36 week 4 day pregnancy.  Post-operative Diagnosis: same Peritoneal nodule  Surgeon: [name]   Assistants: none  Anesthesia: Local anesthesia 0.25.% bupivacaine and Spinal anesthesia  ASA Class: 2  Procedure Details  The patient was seen in the Holding Room. The risks, benefits, complications, treatment options, and expected outcomes were discussed with the patient.  The patient concurred with the proposed plan, giving informed consent. The risks of anesthesia, infection, bleeding and possible injury to other organs discussed. Injury to bowel, bladder, or ureter with possible need for repair discussed. Possible need for transfusion with secondary risks of hepatitis or HIV acquisition discussed. Post operative complications to include but not limited to DVT, PE and Pneumonia noted. The site of surgery properly noted/marked. The patient was taken to Operating Room # 9, identified as Laura [name] and the procedure verified as C-Section Delivery. A Time Out was held and the above information confirmed.  After induction of anesthesia, the patient was draped and prepped in the usual sterile manner. A Pfannenstiel incision was made and carried down through the subcutaneous tissue to the fascia. Fascial incision was made and extended transversely using Mayo scissors. The fascia was separated from the underlying rectus tissue superiorly and inferiorly. The peritoneum was identified and entered. Peritoneal incision was extended longitudinally. The utero-vesical peritoneal reflection was incised transversely and the bladder flap was bluntly freed from the lower uterine segment. A low transverse uterine incision(Kerr hysterotomy) was made. Delivered from OA presentation was a  female with Apgar scores of 8 at one minute and 9 at five minutes. Bulb suctioning gently performed.  Neonatal team in attendance.After the umbilical cord was clamped and cut cord blood was obtained for evaluation. The placenta was removed intact and appeared normal. The uterus was curetted with a dry lap pack. Good hemostasis was noted.The uterine outline, tubes and ovaries appeared normal. The uterine incision was closed with running locked sutures of 0 Monocryl x 2 layers. Hemostasis was observed. Lavage was carried out until clear.The parietal peritoneal nodule excised and sent to pathology. The fascia was then reapproximated with running sutures of 0 Monocryl. The skin was reapproximated with staples after [place] closure with 2-0 plain.  Instrument, sponge, and needle counts were correct prior the abdominal closure and at the conclusion of the case.   Findings: Peritoneal nodule Nl tubes and ovaries  Estimated Blood Loss:  300 mL         Drains: foley                 Specimens: placenta and nodule                 Complications:  None; patient tolerated the procedure well.         Disposition: PACU - hemodynamically stable.         Condition: stable  Attending Attestation: I performed the procedure.

## 2013-05-20 NOTE — Anesthesia Procedure Notes (Signed)
Spinal  Patient location during procedure: OR Start time: 05/20/2013 5:01 AM Staffing Performed by: anesthesiologist  Preanesthetic Checklist Completed: patient identified, site marked, surgical consent, pre-op evaluation, timeout performed, IV checked, risks and benefits discussed and monitors and equipment checked Spinal Block Patient position: sitting Prep: site prepped and draped and DuraPrep Patient monitoring: heart rate, cardiac monitor, continuous pulse ox and blood pressure Approach: midline Location: L3-4 Injection technique: single-shot Needle Needle type: Sprotte and Pencan  Needle gauge: 24 G Needle length: 10 cm Assessment Sensory level: T4 Additional Notes Clear free flow CSF on first attempt.  No paresthesia.  Patient tolerated procedure well with no apparent complications.  Charlton Haws, MD

## 2013-05-21 ENCOUNTER — Encounter (HOSPITAL_COMMUNITY): Payer: Self-pay | Admitting: Obstetrics and Gynecology

## 2013-05-21 LAB — CBC
HCT: 27.3 % — ABNORMAL LOW (ref 36.0–46.0)
Hemoglobin: 8.8 g/dL — ABNORMAL LOW (ref 12.0–15.0)
MCH: 26.7 pg (ref 26.0–34.0)
MCHC: 32.2 g/dL (ref 30.0–36.0)
MCV: 83 fL (ref 78.0–100.0)
PLATELETS: 256 10*3/uL (ref 150–400)
RBC: 3.29 MIL/uL — AB (ref 3.87–5.11)
RDW: 14.9 % (ref 11.5–15.5)
WBC: 10.4 10*3/uL (ref 4.0–10.5)

## 2013-05-21 NOTE — Progress Notes (Signed)
Patient ID: Laura [name], female   DOB: Aug 29, 1970, 43 y.o.   MRN: [id number] POD # 1  Subjective: Pt reports feeling tired, but well / Pain controlled with Ibuprofen.  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 rate 18   Intake/Output Summary (Last 24 hours) at 05/21/13 2122 Last data filed at 05/21/13 4825  Gross per 24 hour  Intake   1200 ml  Output   1200 ml  Net      0 ml      Recent Labs  05/20/13 0415 05/21/13 0605  WBC 9.8 10.4  HGB 10.6* 8.8*  HCT 32.2* 27.3*  PLT 287 256    Blood type: O POS Rubella:   Immune   Physical Exam:  General: alert, cooperative and no distress CV: Regular rate and rhythm Resp: clear Abdomen: soft, nontender, normal bowel sounds Incision: covered with large, occlusive dressing; C/D/I Uterine Fundus: firm, below umbilicus, nontender Lochia: minimal Ext: Homans sign is negative, no sign of DVT and no edema, redness or tenderness in the calves or thighs    A/P: POD # 1/ G2 P2 0 0 2 S/P elective repeat C/Section @ 36w 5d ABL Anemia; will add fe supplement POD 2 or 3 Labs pending for peds w/u for Trisomy XXX Doing well Continue routine post op orders   Signed: Darleen Crocker, MSN, [facility], [place] 05/21/2013, 8:07 AM

## 2013-05-21 NOTE — Addendum Note (Signed)
Addendum created 05/21/13 1543 by Asher Muir, CRNA   Modules edited: Notes Section   Notes Section:  File: [id number]

## 2013-05-21 NOTE — Anesthesia Postprocedure Evaluation (Signed)
Anesthesia Post Note  Patient: Laura [name]  Procedure(s) Performed: Procedure(s) (LRB): CESAREAN SECTION (N/A)  Anesthesia type: Spinal  Patient location: Mother/Baby  Post pain: Pain level controlled  Post assessment: Post-op Vital signs reviewed  Last Vitals:  Filed Vitals:   05/21/13 0635  BP: 102/66  Pulse: 73  Temp: 36.6 C  Resp:     Post vital signs: Reviewed  Level of consciousness: awake  Complications: No apparent anesthesia complications

## 2013-05-22 MED ORDER — FERROUS SULFATE 325 (65 FE) MG PO TABS
325.0000 mg | ORAL_TABLET | Freq: Two times a day (BID) | ORAL | Status: DC
  Administered 2013-05-22 – 2013-05-23 (×3): 325 mg via ORAL
  Filled 2013-05-22 (×3): qty 1

## 2013-05-22 NOTE — Progress Notes (Signed)
Patient ID: ADAYAH AROCHO, female   DOB: [birth year], 43 y.o.   MRN: [id number] Subjective: POD# 2 Information for the patient's newborn:  Affie, Gasner Girl Aneliz [[id number]]  female  Reports feeling well Feeding: bottle Patient reports tolerating PO.  Breast symptoms: none Pain controlled with ibuprofen (OTC) and narcotic analgesics including Percocet Denies HA/SOB/C/P/N/V/dizziness. Flatus present, (+) BM yesterday. She reports vaginal bleeding as normal, without clots.  She is ambulating, urinating without difficult.     Objective:   VS:  Filed Vitals:   05/21/13 0130 05/21/13 0635 05/21/13 1815 05/22/13 0600  BP: 103/63 102/66 106/68 115/71  Pulse: 82 73 76 80  Temp: 98.1 F (36.7 C) 97.8 F (36.6 C) 98 F (36.7 C) 98 F (36.7 C)  TempSrc:   Oral Oral  Resp:   18 18  Height:      Weight:      SpO2: 98% 98%       Intake/Output Summary (Last 24 hours) at 05/22/13 0941 Last data filed at 05/21/13 1200  Gross per 24 hour  Intake    500 ml  Output    600 ml  Net   -100 ml        Recent Labs  05/20/13 0415 05/21/13 0605  WBC 9.8 10.4  HGB 10.6* 8.8*  HCT 32.2* 27.3*  PLT 287 256     Blood type: --/--/O POS, O POS (02/17 0415)  Rubella:       Physical Exam:  General: alert, cooperative and no distress Abdomen: soft, nontender, normal bowel sounds Incision: Tegaderm and Honeycomb intact - small dried blood on Honeycomb, borders marked Uterine Fundus: firm, 1 FB below umbilicus, nontender Lochia: minimal Ext: extremities normal, atraumatic, no cyanosis or edema, Homans sign is negative, no sign of DVT and no edema, redness or tenderness in the calves or thighs      Assessment/Plan: 43 y.o.   POD# 2.  s/p Cesarean Delivery.  Indications: SROM @ 36 wks, Repeat                Principal Problem:   Postpartum care following cesarean delivery (05/20/13) Active Problems:   S/P repeat low transverse C-section  Doing well, stable.               Regular diet as  tolerated Encourage more ambulation today Increase po hydration Routine post-op care Anticipate discharge home tomorrow AM  Laury Deep, M, MSN, CNM 05/22/2013, 9:41 AM

## 2013-05-23 MED ORDER — FERROUS SULFATE 325 (65 FE) MG PO TABS: 325.0000 mg | ORAL_TABLET | Freq: Two times a day (BID) | ORAL | Status: DC

## 2013-05-23 MED ORDER — IBUPROFEN 600 MG PO TABS: 600.0000 mg | ORAL_TABLET | Freq: Four times a day (QID) | ORAL | Status: DC | PRN

## 2013-05-23 MED ORDER — SENNOSIDES-DOCUSATE SODIUM 8.6-50 MG PO TABS: 2.0000 | ORAL_TABLET | Freq: Every evening | ORAL | Status: DC | PRN

## 2013-05-23 MED ORDER — OXYCODONE-ACETAMINOPHEN 5-325 MG PO TABS: 1.0000 | ORAL_TABLET | ORAL | Status: DC | PRN

## 2013-05-23 NOTE — Progress Notes (Signed)
Patient ID: Laura [name], female   DOB: [birth year], 43 y.o.   MRN: [id number] POD # 3  Subjective: Pt reports feeling well and eager for d/c home / Pain controlled with motrin and rare percocet Tolerating po/Voiding without problems/ No n/v/Flatus pos Activity: out of bed and ambulate Bleeding is light Newborn info:  Information for the patient's newborn:  Nyisha, Clippard Girl Mykela [[id number]]  female Feeding: bottle   Objective: VS: Blood pressure 108/71, pulse 79, temperature 98.2 F (36.8 C), temperature source Oral, resp. rate 17.   LABS:  Recent Labs  05/21/13 0605  WBC 10.4  HGB 8.8*  PLT 256                             Physical Exam:  General: alert, cooperative and no distress CV: Regular rate and rhythm Resp: clear Abdomen: soft, nontender, normal bowel sounds Incision: Covered with Tegaderm and honeycomb dressing; well approximated. Uterine Fundus: firm, below umbilicus, nontender Lochia: minimal Ext: edema +2 pedal and pretib and Homans sign is negative, no sign of DVT    A/P: POD # 3/ G2 P2 0 0 2 / S/P elective repeat C/Section ABL Anemia Doing well and stable for discharge home RX's: Ibuprofen 600mg  po Q 6 hrs prn pain #30 Refill x 1 Percocet 5/325 1 - 2 tabs po every 6 hrs prn pain  #15 No refill Niferex 150mg  po QD/BID #30/#60 Refill x 1 Colace 100mg  po up to TID prn #30 Ref x 1 Staple removal in the office Tuesday 05/27/13 Rt pp visit in 6 weeks    Signed: Darleen Crocker, MSN, [facility] 05/23/2013, 8:14 AM

## 2013-05-23 NOTE — Discharge Summary (Signed)
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  Repeat C/S planned. Prenatal Procedures: NST and ultrasound Intrapartum Procedures: cesarean: low cervical, transverse Postpartum Procedures: none Complications-Operative and Postpartum: none Hemoglobin  Date Value Ref Range Status  05/21/2013 8.8* 12.0 - 15.0 g/dL Final     DELTA CHECK NOTED     REPEATED TO VERIFY     HCT  Date Value Ref Range Status  05/21/2013 27.3* 36.0 - 46.0 % Final    Physical Exam:  General: alert, cooperative and no distress Lochia: appropriate Uterine Fundus: firm Incision: Covered with Tegaderm and honeycomb dressing; well approximated. DVT Evaluation: No evidence of DVT seen on physical exam. Negative Homan's sign. Calf/Ankle edema is present.  Discharge Diagnoses: G2 P2 s/p rpt c/s at 36wks 4days.  Infant with suspected trisomy, labs pending **Per op note, peritoneal specimen sent.  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  Dr Laura [name] has reviewed with pt.  Oncology to contact pt for further evaluation and CT**  Discharge Information: Date: 05/23/2013 Activity: pelvic rest Diet: routine Medications: PNV, Ibuprofen, Colace, Iron and Percocet Condition: stable Instructions: refer to practice specific booklet Discharge to: home Follow-up Information   Follow up with Laura Kim, MD In 6 weeks. (Staple removal in the office Tues 05/27/13)    Specialty:  Obstetrics and Gynecology   Contact information:   [place] [address] [phone]       Newborn Data: Live born female on 05/20/13 Laura [name]) Birth Weight: 6 lb 5.2 oz (2869 g) APGAR: 8, 9  Home with mother.  Laura [name] K 05/23/2013, 8:35 AM

## 2013-05-26 ENCOUNTER — Telehealth: Payer: Self-pay | Admitting: *Deleted

## 2013-05-26 NOTE — Telephone Encounter (Signed)
Spoke with patient by phone and gave appointment with Dr. Benay Spice for 05/29/13.  Contact names and numbers were provided.  All questions were answered.

## 2013-05-28 ENCOUNTER — Encounter: Payer: Self-pay | Admitting: Oncology

## 2013-05-28 ENCOUNTER — Telehealth: Payer: Self-pay | Admitting: Oncology

## 2013-05-28 ENCOUNTER — Ambulatory Visit: Payer: BC Managed Care – PPO

## 2013-05-28 ENCOUNTER — Ambulatory Visit (HOSPITAL_BASED_OUTPATIENT_CLINIC_OR_DEPARTMENT_OTHER): Payer: BC Managed Care – PPO

## 2013-05-28 ENCOUNTER — Encounter (HOSPITAL_COMMUNITY): Payer: Self-pay | Admitting: *Deleted

## 2013-05-28 ENCOUNTER — Ambulatory Visit (HOSPITAL_BASED_OUTPATIENT_CLINIC_OR_DEPARTMENT_OTHER): Payer: BC Managed Care – PPO | Admitting: Oncology

## 2013-05-28 VITALS — BP 152/77 | HR 99 | Temp 98.6°F | Resp 20 | Ht 61.0 in | Wt 197.9 lb

## 2013-05-28 DIAGNOSIS — C7989 Secondary malignant neoplasm of other specified sites: Secondary | ICD-10-CM

## 2013-05-28 DIAGNOSIS — C801 Malignant (primary) neoplasm, unspecified: Secondary | ICD-10-CM | POA: Insufficient documentation

## 2013-05-28 DIAGNOSIS — C786 Secondary malignant neoplasm of retroperitoneum and peritoneum: Secondary | ICD-10-CM

## 2013-05-28 DIAGNOSIS — D649 Anemia, unspecified: Secondary | ICD-10-CM

## 2013-05-28 LAB — CBC WITH DIFFERENTIAL/PLATELET
BASO%: 0.3 % (ref 0.0–2.0)
Basophils Absolute: 0 10*3/uL (ref 0.0–0.1)
EOS%: 0.6 % (ref 0.0–7.0)
Eosinophils Absolute: 0.1 10*3/uL (ref 0.0–0.5)
HEMATOCRIT: 28.1 % — AB (ref 34.8–46.6)
HGB: 9.2 g/dL — ABNORMAL LOW (ref 11.6–15.9)
LYMPH%: 11.5 % — AB (ref 14.0–49.7)
MCH: 26.7 pg (ref 25.1–34.0)
MCHC: 32.7 g/dL (ref 31.5–36.0)
MCV: 81.5 fL (ref 79.5–101.0)
MONO#: 1 10*3/uL — AB (ref 0.1–0.9)
MONO%: 7.2 % (ref 0.0–14.0)
NEUT#: 10.8 10*3/uL — ABNORMAL HIGH (ref 1.5–6.5)
NEUT%: 80.4 % — AB (ref 38.4–76.8)
PLATELETS: 448 10*3/uL — AB (ref 145–400)
RBC: 3.45 10*6/uL — ABNORMAL LOW (ref 3.70–5.45)
RDW: 14.9 % — ABNORMAL HIGH (ref 11.2–14.5)
WBC: 13.5 10*3/uL — ABNORMAL HIGH (ref 3.9–10.3)
lymph#: 1.5 10*3/uL (ref 0.9–3.3)

## 2013-05-28 LAB — COMPREHENSIVE METABOLIC PANEL (CC13)
ALT: 16 U/L (ref 0–55)
AST: 16 U/L (ref 5–34)
Albumin: 2.4 g/dL — ABNORMAL LOW (ref 3.5–5.0)
Alkaline Phosphatase: 286 U/L — ABNORMAL HIGH (ref 40–150)
Anion Gap: 12 mEq/L — ABNORMAL HIGH (ref 3–11)
BILIRUBIN TOTAL: 0.35 mg/dL (ref 0.20–1.20)
BUN: 12.7 mg/dL (ref 7.0–26.0)
CO2: 25 mEq/L (ref 22–29)
Calcium: 9.1 mg/dL (ref 8.4–10.4)
Chloride: 108 mEq/L (ref 98–109)
Creatinine: 0.8 mg/dL (ref 0.6–1.1)
Glucose: 94 mg/dl (ref 70–140)
Potassium: 3.8 mEq/L (ref 3.5–5.1)
Sodium: 145 mEq/L (ref 136–145)
Total Protein: 6.2 g/dL — ABNORMAL LOW (ref 6.4–8.3)

## 2013-05-28 NOTE — Progress Notes (Signed)
Met with Laura [name] and husband. Explained role of nurse navigator. Patient's cancer diagnosis is still being worked-up and unabke to provide education on cancer diagnosis this visit.  [place] resources provided to patient, including SW service information.  Contact names and phone numbers were provided for entire [facility] team.  Teach back method was used.   Will continue to follow.

## 2013-05-28 NOTE — Progress Notes (Signed)
Checked in new pt with no financial concerns. °

## 2013-05-28 NOTE — Progress Notes (Signed)
[facility] Patient Consult   Referring MD: Adenike Shidler 43 y.o.  15-Dec-1970    Reason for Referral: Metastatic adenocarcinoma     HPI: She was taken to a cesarean section at 36 weeks and 4 days into pregnancy on 05/20/2013. A single parietal peritoneal nodule was excised. 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  She reports feeling well prior to the cesarean section. She has noted an uneventful operative recovery. She was started on iron following C-section and has developed constipation.  Past Medical History  Diagnosis Date  .  G4 P2, one miscarriage, one abortion    .    Marland Kitchen      Past Surgical History  Procedure Laterality Date  . Cesarean section    . Cesarean section N/A 05/20/2013    Procedure: CESAREAN SECTION;  Surgeon: [name], MD;  Location: [facility];  Service: Obstetrics;  Laterality: N/A;     Family history: A paternal aunt had "cancer ", a paternal cousin died of "cancer "in her 34s, her maternal grandmother had lymphoma, a maternal aunt had breast cancer in her 106s. No other family history of cancer. She has one sister.   Current outpatient prescriptions:ferrous sulfate 325 (65 FE) MG tablet, Take 1 tablet (325 mg total) by mouth 2 (two) times daily with a meal., Disp: 60 tablet, Rfl: 1;  HYDROcodone-acetaminophen (NORCO/VICODIN) 5-325 MG per tablet, Take 1 tablet by mouth every 6 (six) hours as needed., Disp: , Rfl: ;  loratadine (CLARITIN) 10 MG tablet, Take 10 mg by mouth daily as needed for allergies., Disp: , Rfl:  acetaminophen (TYLENOL) 500 MG tablet, Take 500 mg by mouth every 6 (six) hours as needed., Disp: , Rfl: ;  ibuprofen (ADVIL,MOTRIN) 600 MG tablet, Take 1 tablet (600 mg total) by mouth every 6 (six) hours as needed., Disp: 30 tablet, Rfl: 0;  senna-docusate (SENOKOT-S) 8.6-50 MG per tablet, Take 2 tablets by mouth  at bedtime as needed for mild constipation., Disp: 30 tablet, Rfl: 0  Allergies: No Known Allergies  Social History:  [place] She works as a Secretary/administrator. She does not use tobacco or alcohol. No transfusion history. No risk factor for HIV or hepatitis.   History  Alcohol Use: Not on file    History  Smoking status  . Never Smoker   Smokeless tobacco  . Never Used     ROS:   Positives include: constipation since starting iron, nausea during the first trimester of pregnancy  A complete ROS was otherwise negative.  Physical Exam:  Blood pressure 152/77, pulse 99, temperature 98.6 F (37 C), temperature source Oral, resp. rate 20, height 5\' 1"  (1.549 m), weight 197 lb 14.4 oz (89.767 kg), unknown if currently breastfeeding.  HEENT:  oropharynx without visible mass, neck without mass Lungs:  clear bilaterally  Cardiac:  Regular rate and rhythm Abdomen:  no hepatosplenomegaly, nontender, no mass, healed low transverse incision, no apparent ascites  Vascular: No leg edema  Lymph nodes: No cervical, supraclavicular, axillary, or inguinal nodes  Neurologic:  alert and oriented, the motor exam appears intact in the upper and lower extremities  Skin:  No rash Musculoskeletal:  no spine tenderness   Breasts: Bilateral breast without mass    LAB:  CBC  Lab Results  Component Value Date   WBC 10.4 05/21/2013   HGB 8.8* 05/21/2013  HCT 27.3* 05/21/2013   MCV 83.0 05/21/2013   PLT 256 05/21/2013       Radiology: None    Assessment/Plan:   1. 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 Cesarean section delivery of her second child 05/20/2013  3. Anemia-potentially related to pregnancy/surgical blood loss versus a malignancy   Disposition:    Ms. Rahming was diagnosed with metastatic adenocarcinoma involving a peritoneal nodule removed at the time of a cesarean section  procedure. There is no apparent primary tumor site based on review of the history and physical examination today. I discussed the differential diagnosis with Ms. Heckel and her husband.  I asked another pathologist to review the peritoneal biopsy slides and he agrees with the adenocarcinoma diagnosis.   We decided to proceed with a staging evaluation to include CTs of the chest, abdomen, and pelvis. We will check a CEA and she will return stool Hemoccult cards. Ms. Nephew will continue iron and a stool softener. I will request immunohistochemical stains on the peritoneal biopsy material.  Her case will be presented at the GI tumor conference on 06/04/2013. She will return for an office visit on the same day.  [place], [place] 05/28/2013, 2:19 PM

## 2013-05-28 NOTE — Telephone Encounter (Signed)
gv and printed appt sched and avs for pt for March....sed gv pt bariu....sent pt to lab

## 2013-05-29 ENCOUNTER — Ambulatory Visit: Payer: BC Managed Care – PPO

## 2013-05-29 ENCOUNTER — Ambulatory Visit: Payer: BC Managed Care – PPO | Admitting: Oncology

## 2013-05-29 LAB — CEA: CEA: 3.9 ng/mL (ref 0.0–5.0)

## 2013-05-30 ENCOUNTER — Encounter: Payer: Self-pay | Admitting: *Deleted

## 2013-05-30 ENCOUNTER — Telehealth: Payer: Self-pay | Admitting: *Deleted

## 2013-05-30 NOTE — Telephone Encounter (Signed)
Called and informed patient that she does not need lab on 03/02.  Per Dr. Benay Spice.  Patient verbalized understanding.

## 2013-05-30 NOTE — Progress Notes (Signed)
[place] Work  Clinical Social Work was referred by patient navigator for assessment of psychosocial needs due to new cancer diagnosis.  Clinical Social Worker contacted patient at home to offer support and assess for needs.  CSW introduced self and explained Furniture conservator/restorer and resources available to assist. Pt eager to meet CSW and CSW will attempt to see Pt at her appt on 06/04/13. CSW provided Pt with CSW info as well.  CSW will continue to follow and assist    Clinical Social Work interventions: Supportive listening  Loren Racer, [place] Social Worker [facility] for [place] Wednesday, Thursday and Friday Phone: [phone] Fax: [phone]

## 2013-06-02 ENCOUNTER — Other Ambulatory Visit: Payer: Self-pay | Admitting: Oncology

## 2013-06-02 ENCOUNTER — Encounter (HOSPITAL_COMMUNITY): Payer: Self-pay

## 2013-06-02 ENCOUNTER — Ambulatory Visit (HOSPITAL_BASED_OUTPATIENT_CLINIC_OR_DEPARTMENT_OTHER): Payer: BC Managed Care – PPO

## 2013-06-02 ENCOUNTER — Ambulatory Visit (HOSPITAL_COMMUNITY)
Admission: RE | Admit: 2013-06-02 | Discharge: 2013-06-02 | Disposition: A | Discharge status: Home / Self Care | Payer: BC Managed Care – PPO | Source: Ambulatory Visit | Attending: Oncology | Admitting: Oncology

## 2013-06-02 ENCOUNTER — Other Ambulatory Visit: Payer: BC Managed Care – PPO

## 2013-06-02 DIAGNOSIS — C482 Malignant neoplasm of peritoneum, unspecified: Secondary | ICD-10-CM | POA: Insufficient documentation

## 2013-06-02 DIAGNOSIS — C786 Secondary malignant neoplasm of retroperitoneum and peritoneum: Secondary | ICD-10-CM

## 2013-06-02 DIAGNOSIS — D649 Anemia, unspecified: Secondary | ICD-10-CM

## 2013-06-02 DIAGNOSIS — C801 Malignant (primary) neoplasm, unspecified: Secondary | ICD-10-CM

## 2013-06-02 DIAGNOSIS — C772 Secondary and unspecified malignant neoplasm of intra-abdominal lymph nodes: Secondary | ICD-10-CM | POA: Insufficient documentation

## 2013-06-02 LAB — FECAL OCCULT BLOOD, GUAIAC: Occult Blood: NEGATIVE

## 2013-06-02 MED ORDER — IOHEXOL 300 MG/ML  SOLN
100.0000 mL | Freq: Once | INTRAMUSCULAR | Status: AC | PRN
  Administered 2013-06-02: 100 mL via INTRAVENOUS

## 2013-06-04 ENCOUNTER — Telehealth: Payer: Self-pay | Admitting: Oncology

## 2013-06-04 ENCOUNTER — Ambulatory Visit (HOSPITAL_BASED_OUTPATIENT_CLINIC_OR_DEPARTMENT_OTHER): Payer: BC Managed Care – PPO | Admitting: Oncology

## 2013-06-04 ENCOUNTER — Telehealth: Payer: Self-pay | Admitting: Gastroenterology

## 2013-06-04 ENCOUNTER — Other Ambulatory Visit: Payer: Self-pay

## 2013-06-04 VITALS — BP 139/68 | HR 111 | Temp 98.2°F | Resp 18 | Ht 61.0 in | Wt 192.2 lb

## 2013-06-04 DIAGNOSIS — C786 Secondary malignant neoplasm of retroperitoneum and peritoneum: Secondary | ICD-10-CM

## 2013-06-04 DIAGNOSIS — C801 Malignant (primary) neoplasm, unspecified: Secondary | ICD-10-CM

## 2013-06-04 DIAGNOSIS — D649 Anemia, unspecified: Secondary | ICD-10-CM

## 2013-06-04 DIAGNOSIS — C7989 Secondary malignant neoplasm of other specified sites: Secondary | ICD-10-CM

## 2013-06-04 MED ORDER — MOVIPREP 100 G PO SOLR: 1.0000 | Freq: Once | ORAL | Status: DC

## 2013-06-04 NOTE — Telephone Encounter (Signed)
Left message on machine to call back colon scheduled for 06/12/13 [facility]

## 2013-06-04 NOTE — Progress Notes (Signed)
   [facility]    OFFICE PROGRESS NOTE   INTERVAL HISTORY:   She returns as scheduled. She feels well. Her only complaint is erythema and drainage at the low abdominal wound. The stool Hemoccult cards returned negative.  Objective:  Vital signs in last 24 hours:  Blood pressure 139/68, pulse 111, temperature 98.2 F (36.8 C), temperature source Oral, resp. rate 18, height 5\' 1"  (1.549 m), weight 192 lb 3.2 oz (87.181 kg), SpO2 100.00%, unknown if currently breastfeeding.    Resp: lungs clear bilaterally Cardio: regular rate and rhythm GI: no hepatomegaly, nontender, induration and erythema surrounding the low transverse incision with serous drainage at the left aspect of the incision.the wound appears closed. Vascular:  No leg edema   Lab Results:  Lab Results  Component Value Date   WBC 13.5* 05/28/2013   HGB 9.2* 05/28/2013   HCT 28.1* 05/28/2013   MCV 81.5 05/28/2013   PLT 448* 05/28/2013   NEUTROABS 10.8* 05/28/2013      Medications: I have reviewed the patient's current medications.  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 Cesarean section delivery of her second child 05/20/2013  3. Anemia-potentially related to pregnancy/surgical blood loss versus a malignancy  4.erythema/induration and drainage at the low transverse incision-we will contact Dr. Ronita Hipps to arrange for an appointment   Disposition: Her case was presented at the GI tumor conference earlier today. 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 I discussed the diagnosis with [name] and her husband. She understands the peritoneal nodule indicates metastatic  disease. It is unlikely treatment will be curative, but she may be a candidate for resection of all measurable disease. I will refer her to Dr. Clovis Riley to consider surgery +/-intraperitoneal chemotherapy. We will refer her to Dr. Ardis Hughs for a colonoscopy.  Ms. Vasbinder will resume iron therapy after the colonoscopy. She will return for an office visit here in one month. We will see her sooner pending the evaluation by Dr. Clovis Riley.   Betsy Coder, MD  06/04/2013  3:25 PM

## 2013-06-04 NOTE — Telephone Encounter (Signed)
Her case was presented at GI cancer conference today.  Incidental uterine implant noted during C section.  Adenocarcinoma, favoring appendiceal by features.  CT scan showed probable mass involving appendix and probably accessible in cecum, multiple nearby nodes.  She needs colonoscopy for biopsy, is also going to be sent to [facility] for to consider surgery, intra-op chemo.  Laura [name], She needs colonoscopy in next 1-2 weeks. I can do it next Thursday at [facility].  Please contact her later today or tomorrow (allow Dr. Benay Spice some time to let her know the recommendation).

## 2013-06-04 NOTE — Telephone Encounter (Signed)
Called Dr. Georgina Snell office to schedule appt.  After Dr. Clovis Riley receives all pt records,slides and scans he will review to  Determine if pt is a candidate for Intra-op chemo. Pt is aware.

## 2013-06-04 NOTE — Telephone Encounter (Signed)
Laura [name] spoke with the pt and she is expecting my call, I will get her scheduled and call after lunch.

## 2013-06-04 NOTE — Telephone Encounter (Signed)
appts made and printed. Pt is aware that i sw Amy @ Dr. Ardis Hughs office per her the nurse Chong Sicilian is working on getting the pt an appt.Marland Kitchenalso the pt is aware that i gv the order to [facility] for her appt w/ Dr. Ronnette Hila.Marland Kitchentd

## 2013-06-05 ENCOUNTER — Encounter (HOSPITAL_COMMUNITY): Payer: Self-pay | Admitting: *Deleted

## 2013-06-05 ENCOUNTER — Other Ambulatory Visit (HOSPITAL_COMMUNITY): Payer: BC Managed Care – PPO

## 2013-06-05 ENCOUNTER — Encounter (HOSPITAL_COMMUNITY): Payer: Self-pay | Admitting: Pharmacy Technician

## 2013-06-05 DIAGNOSIS — C801 Malignant (primary) neoplasm, unspecified: Secondary | ICD-10-CM

## 2013-06-05 HISTORY — DX: Malignant (primary) neoplasm, unspecified: C80.1

## 2013-06-06 ENCOUNTER — Encounter (HOSPITAL_COMMUNITY): Admission: RE | Payer: Self-pay | Source: Ambulatory Visit

## 2013-06-06 ENCOUNTER — Inpatient Hospital Stay (HOSPITAL_COMMUNITY)
Admission: RE | Admit: 2013-06-06 | Payer: BC Managed Care – PPO | Source: Ambulatory Visit | Admitting: Obstetrics and Gynecology

## 2013-06-06 SURGERY — Surgical Case
Anesthesia: Spinal

## 2013-06-09 ENCOUNTER — Telehealth: Payer: Self-pay | Admitting: *Deleted

## 2013-06-09 NOTE — Telephone Encounter (Signed)
Colon scheduled, pt instructed and medications reviewed.  Patient instructions mailed to home.  Patient to call with any questions or concerns.  

## 2013-06-09 NOTE — Telephone Encounter (Signed)
Spoke with patient by phone.  She understands she has a colonoscopy on 06/12/13.  She understands to contact this office if she has not heard back from Dr. Georgina Snell office within a few days.  She denied any questions and expressed appreciation for the call.

## 2013-06-10 ENCOUNTER — Encounter: Payer: Self-pay | Admitting: Gastroenterology

## 2013-06-11 NOTE — Anesthesia Preprocedure Evaluation (Addendum)
Anesthesia Evaluation  Patient identified by MRN, date of birth, ID band Patient awake    Reviewed: Allergy & Precautions, H&P , NPO status , Patient's Chart, lab work & pertinent test results  Airway Mallampati: II TM Distance: >3 FB Neck ROM: full    Dental no notable dental hx. (+) Teeth Intact, Dental Advisory Given   Pulmonary neg pulmonary ROS,  breath sounds clear to auscultation  Pulmonary exam normal       Cardiovascular Exercise Tolerance: Good negative cardio ROS  Rhythm:regular Rate:Normal     Neuro/Psych negative neurological ROS  negative psych ROS   GI/Hepatic negative GI ROS, Neg liver ROS,   Endo/Other  negative endocrine ROS  Renal/GU negative Renal ROS  negative genitourinary   Musculoskeletal   Abdominal   Peds  Hematology negative hematology ROS (+)   Anesthesia Other Findings ? Appendix cancer  Reproductive/Obstetrics negative OB ROS                           Anesthesia Physical Anesthesia Plan  ASA: II  Anesthesia Plan: MAC   Post-op Pain Management:    Induction:   Airway Management Planned: Nasal Cannula  Additional Equipment:   Intra-op Plan:   Post-operative Plan:   Informed Consent: I have reviewed the patients History and Physical, chart, labs and discussed the procedure including the risks, benefits and alternatives for the proposed anesthesia with the patient or authorized representative who has indicated his/her understanding and acceptance.   Dental Advisory Given  Plan Discussed with: CRNA and Surgeon  Anesthesia Plan Comments:         Anesthesia Quick Evaluation

## 2013-06-12 ENCOUNTER — Encounter (HOSPITAL_COMMUNITY): Payer: BC Managed Care – PPO | Admitting: Anesthesiology

## 2013-06-12 ENCOUNTER — Ambulatory Visit (HOSPITAL_COMMUNITY)
Admission: RE | Admit: 2013-06-12 | Discharge: 2013-06-12 | Disposition: A | Discharge status: Home / Self Care | Payer: BC Managed Care – PPO | Source: Ambulatory Visit | Attending: Gastroenterology | Admitting: Gastroenterology

## 2013-06-12 ENCOUNTER — Encounter (HOSPITAL_COMMUNITY): Payer: Self-pay | Admitting: Certified Registered"

## 2013-06-12 ENCOUNTER — Ambulatory Visit (HOSPITAL_COMMUNITY): Payer: BC Managed Care – PPO | Admitting: Anesthesiology

## 2013-06-12 ENCOUNTER — Encounter (HOSPITAL_COMMUNITY)
Admission: RE | Disposition: A | Discharge status: Home / Self Care | Payer: Self-pay | Source: Ambulatory Visit | Attending: Gastroenterology

## 2013-06-12 DIAGNOSIS — C801 Malignant (primary) neoplasm, unspecified: Secondary | ICD-10-CM

## 2013-06-12 DIAGNOSIS — O9989 Other specified diseases and conditions complicating pregnancy, childbirth and the puerperium: Principal | ICD-10-CM

## 2013-06-12 DIAGNOSIS — R933 Abnormal findings on diagnostic imaging of other parts of digestive tract: Secondary | ICD-10-CM

## 2013-06-12 DIAGNOSIS — C786 Secondary malignant neoplasm of retroperitoneum and peritoneum: Secondary | ICD-10-CM | POA: Insufficient documentation

## 2013-06-12 DIAGNOSIS — O99893 Other specified diseases and conditions complicating puerperium: Secondary | ICD-10-CM | POA: Insufficient documentation

## 2013-06-12 DIAGNOSIS — D649 Anemia, unspecified: Secondary | ICD-10-CM | POA: Insufficient documentation

## 2013-06-12 DIAGNOSIS — C181 Malignant neoplasm of appendix: Secondary | ICD-10-CM | POA: Insufficient documentation

## 2013-06-12 DIAGNOSIS — R599 Enlarged lymph nodes, unspecified: Secondary | ICD-10-CM | POA: Insufficient documentation

## 2013-06-12 HISTORY — DX: Malignant (primary) neoplasm, unspecified: C80.1

## 2013-06-12 HISTORY — PX: COLONOSCOPY WITH PROPOFOL: SHX5780

## 2013-06-12 SURGERY — COLONOSCOPY WITH PROPOFOL
Anesthesia: Monitor Anesthesia Care

## 2013-06-12 MED ORDER — LIDOCAINE HCL (CARDIAC) 20 MG/ML IV SOLN
INTRAVENOUS | Status: DC | PRN
  Administered 2013-06-12: 50 mg via INTRAVENOUS

## 2013-06-12 MED ORDER — SODIUM CHLORIDE 0.9 % IV SOLN: INTRAVENOUS | Status: DC

## 2013-06-12 MED ORDER — PROPOFOL 10 MG/ML IV BOLUS
INTRAVENOUS | Status: AC
  Filled 2013-06-12: qty 20

## 2013-06-12 MED ORDER — LACTATED RINGERS IV SOLN
INTRAVENOUS | Status: DC | PRN
  Administered 2013-06-12: 07:00:00 via INTRAVENOUS

## 2013-06-12 MED ORDER — PROPOFOL INFUSION 10 MG/ML OPTIME
INTRAVENOUS | Status: DC | PRN
  Administered 2013-06-12: 120 ug/kg/min via INTRAVENOUS

## 2013-06-12 SURGICAL SUPPLY — 22 items

## 2013-06-12 NOTE — Anesthesia Postprocedure Evaluation (Signed)
  Anesthesia Post-op Note  Patient: Laura [name]  Procedure(s) Performed: Procedure(s) (LRB): COLONOSCOPY WITH PROPOFOL (N/A)  Patient Location: PACU  Anesthesia Type: MAC  Level of Consciousness: awake and alert   Airway and Oxygen Therapy: Patient Spontanous Breathing  Post-op Pain: mild  Post-op Assessment: Post-op Vital signs reviewed, Patient's Cardiovascular Status Stable, Respiratory Function Stable, Patent Airway and No signs of Nausea or vomiting  Last Vitals:  Filed Vitals:   06/12/13 0827  BP: 139/103  Temp:   Resp: 17    Post-op Vital Signs: stable   Complications: No apparent anesthesia complications

## 2013-06-12 NOTE — Interval H&P Note (Signed)
History and Physical Interval Note:  06/12/2013 7:24 AM  Laura [name]  has presented today for surgery, with the diagnosis of Adenocarcinoma [199.1]  The various methods of treatment have been discussed with the patient and family. After consideration of risks, benefits and other options for treatment, the patient has consented to  Procedure(s): COLONOSCOPY WITH PROPOFOL (N/A) as a surgical intervention .  The patient's history has been reviewed, patient examined, no change in status, stable for surgery.  I have reviewed the patient's chart and labs.  Questions were answered to the patient's satisfaction.     Milus Banister

## 2013-06-12 NOTE — Transfer of Care (Signed)
Immediate Anesthesia Transfer of Care Note  Patient: Laura [name]  Procedure(s) Performed: Procedure(s): COLONOSCOPY WITH PROPOFOL (N/A)  Patient Location: PACU  Anesthesia Type:MAC  Level of Consciousness: awake, alert  and oriented  Airway & Oxygen Therapy: Patient Spontanous Breathing and Patient connected to face mask oxygen  Post-op Assessment: Report given to PACU RN and Post -op Vital signs reviewed and stable  Post vital signs: Reviewed and stable  Complications: No apparent anesthesia complications

## 2013-06-12 NOTE — H&P (View-Only) (Signed)
   [facility]    OFFICE PROGRESS NOTE   INTERVAL HISTORY:   She returns as scheduled. She feels well. Her only complaint is erythema and drainage at the low abdominal wound. The stool Hemoccult cards returned negative.  Objective:  Vital signs in last 24 hours:  Blood pressure 139/68, pulse 111, temperature 98.2 F (36.8 C), temperature source Oral, resp. rate 18, height 5' 1" (1.549 m), weight 192 lb 3.2 oz (87.181 kg), SpO2 100.00%, unknown if currently breastfeeding.    Resp: lungs clear bilaterally Cardio: regular rate and rhythm GI: no hepatomegaly, nontender, induration and erythema surrounding the low transverse incision with serous drainage at the left aspect of the incision.the wound appears closed. Vascular:  No leg edema   Lab Results:  Lab Results  Component Value Date   WBC 13.5* 05/28/2013   HGB 9.2* 05/28/2013   HCT 28.1* 05/28/2013   MCV 81.5 05/28/2013   PLT 448* 05/28/2013   NEUTROABS 10.8* 05/28/2013      Medications: I have reviewed the patient's current medications.  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 Cesarean section delivery of her second child 05/20/2013  3. Anemia-potentially related to pregnancy/surgical blood loss versus a malignancy  4.erythema/induration and drainage at the low transverse incision-we will contact Dr. Taavon to arrange for an appointment   Disposition: Her case was presented at the GI tumor conference earlier today. 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 I discussed the diagnosis with Ms. Denis and her husband. She understands the peritoneal nodule indicates metastatic  disease. It is unlikely treatment will be curative, but she may be a candidate for resection of all measurable disease. I will refer her to Dr. Levine to consider surgery +/-intraperitoneal chemotherapy. We will refer her to Dr. Jacobs for a colonoscopy.  Ms. Meikle will resume iron therapy after the colonoscopy. She will return for an office visit here in one month. We will see her sooner pending the evaluation by Dr. Levine.   Tayari Yankee, MD  06/04/2013  3:25 PM    

## 2013-06-12 NOTE — Op Note (Signed)
[facility] [place], [address]   COLONOSCOPY PROCEDURE REPORT  PATIENT: Laura [name], Laura [name]  MR#: [id number] BIRTHDATE: 01-26-1971 , 42  yrs. old GENDER: Female ENDOSCOPIST: Milus Banister, MD REFERRED NF:AOZH Edrick Kins, M.D. PROCEDURE DATE:  06/12/2013 PROCEDURE:   Colonoscopy with biopsy First Screening Colonoscopy - Avg.  risk and is 50 yrs.  old or older - No.  Prior Negative Screening - Now for repeat screening. N/A  History of Adenoma - Now for follow-up colonoscopy & has been > or = to 3 yrs.  N/A  Polyps Removed Today? No.  Recommend repeat exam, <10 yrs? No. ASA CLASS:   Class II INDICATIONS:incidentally noted implant on uterus at time of c-section last month, implant removed and showed adenocarcinoma with signet and mucinous features; CT scan suggests appendiceal primary, involvement of base of cecum MEDICATIONS: MAC sedation, administered by CRNA  DESCRIPTION OF PROCEDURE:   After the risks benefits and alternatives of the procedure were thoroughly explained, informed consent was obtained.  A digital rectal exam revealed no abnormalities of the rectum.   The Pentax Colonoscope [id number] endoscope was introduced through the anus and advanced to the cecum, which was identified by the ileocecal valve. No adverse events experienced.   The quality of the prep was good.  The instrument was then slowly withdrawn as the colon was fully examined.  COLON FINDINGS: There was tumor in the cecum.  This was a firm, friable mass that circumferentially involved the appendiceal orifice and it was biopsies multiple times.  The examination was otherwise normal.  Retroflexed views revealed no abnormalities. The time to cecum=3 minutes 00 seconds.  Withdrawal time=10 minutes 00 seconds.  The scope was withdrawn and the procedure completed. COMPLICATIONS: There were no complications.  ENDOSCOPIC IMPRESSION: There was tumor in the cecum.  This was a firm,  friable mass that circumferentially involved the appendiceal orifice and it was biopsies multiple times.  The examination was otherwise normal.  RECOMMENDATIONS: Await final pathology.   eSigned:  Milus Banister, MD 06/12/2013 8:06 AM   cc: Brien Few, MD

## 2013-06-12 NOTE — Discharge Instructions (Signed)

## 2013-06-13 ENCOUNTER — Encounter (HOSPITAL_COMMUNITY): Payer: Self-pay | Admitting: Gastroenterology

## 2013-06-16 ENCOUNTER — Telehealth: Payer: Self-pay | Admitting: *Deleted

## 2013-06-16 NOTE — Telephone Encounter (Signed)
Received phone message from patient stating that the earliest appointment she could get with Dr. Clovis Riley was for 07/04/13.  She wants to know if Dr. Benay Spice is okay with the date and also wants to know if she should keep appointment with Dr. Benay Spice for 07/02/13?  Message sent to Dr. Benay Spice.

## 2013-06-19 ENCOUNTER — Other Ambulatory Visit: Payer: Self-pay | Admitting: *Deleted

## 2013-06-19 ENCOUNTER — Telehealth: Payer: Self-pay | Admitting: *Deleted

## 2013-06-19 NOTE — Telephone Encounter (Signed)
Spoke with patient by phone per request of Dr. Benay Spice.  He wants to see patient after visit with Dr. Clovis Riley scheduled 07/04/13.  POF sent to scheduler to f/u Dr. Benay Spice 3/6 or 3/7.  Patient aware of above.

## 2013-06-24 ENCOUNTER — Telehealth: Payer: Self-pay | Admitting: Oncology

## 2013-06-24 ENCOUNTER — Other Ambulatory Visit: Payer: Self-pay | Admitting: *Deleted

## 2013-06-24 NOTE — Telephone Encounter (Signed)
S/W PT RE APPT FOR 4/6 @ 4PM. D/T PER EMAIL FROM BS.

## 2013-07-02 ENCOUNTER — Ambulatory Visit: Payer: BC Managed Care – PPO | Admitting: Oncology

## 2013-07-07 ENCOUNTER — Telehealth: Payer: Self-pay | Admitting: Oncology

## 2013-07-07 ENCOUNTER — Ambulatory Visit (HOSPITAL_BASED_OUTPATIENT_CLINIC_OR_DEPARTMENT_OTHER): Payer: BC Managed Care – PPO | Admitting: Oncology

## 2013-07-07 ENCOUNTER — Other Ambulatory Visit: Payer: Self-pay | Admitting: *Deleted

## 2013-07-07 VITALS — BP 146/85 | HR 98 | Temp 98.7°F | Resp 18 | Ht 61.0 in | Wt 186.3 lb

## 2013-07-07 DIAGNOSIS — C181 Malignant neoplasm of appendix: Secondary | ICD-10-CM | POA: Insufficient documentation

## 2013-07-07 DIAGNOSIS — D649 Anemia, unspecified: Secondary | ICD-10-CM

## 2013-07-07 MED ORDER — CAPECITABINE 500 MG PO TABS: 1500.0000 mg | ORAL_TABLET | Freq: Two times a day (BID) | ORAL | Status: DC

## 2013-07-07 NOTE — Telephone Encounter (Signed)
gave pt appt for lab, chemo class and ML emailed michelle regarding chemo

## 2013-07-07 NOTE — Telephone Encounter (Signed)
Xeloda script forwarded to managed care department.

## 2013-07-07 NOTE — Progress Notes (Signed)
  [facility] OFFICE PROGRESS NOTE   Diagnosis: Appendix carcinoma  INTERVAL HISTORY:   She returns as scheduled. The abdominal wound has healed. She currently has a cold and allergy symptoms. Dr. Ardis Hughs performed a colonoscopy 06/12/2013 a tumor was noted at the cecum with involvement of the appendiceal orifice the exam was otherwise normal. The pathology revealed an invasive poorly differentiated adenocarcinoma with signet ring cell features. Mismatch repair protein expression was normal. She saw Dr. Clovis Riley 07/04/2013. He felt the diagnosis was most consistent with a high grade appendiceal neoplasm. He recommends FOLFOX chemotherapy followed by HIPEC.   Objective:  Vital signs in last 24 hours:  Blood pressure 146/85, pulse 98, temperature 98.7 F (37.1 C), temperature source Oral, resp. rate 18, height $RemoveBe'5\' 1"'[id number]$  (1.549 m), weight 186 lb 4.8 oz (84.505 kg), not currently breastfeeding.    Resp: Lungs clear bilaterally Cardio: Regular rate and rhythm GI: No hepatomegaly, nontender, no mass, healed low transverse incision without surrounding erythema Vascular: No leg edema   Lab Results:  Lab Results  Component Value Date   WBC 13.5* 05/28/2013   HGB 9.2* 05/28/2013   HCT 28.1* 05/28/2013   MCV 81.5 05/28/2013   PLT 448* 05/28/2013   NEUTROABS 10.8* 05/28/2013    Lab Results  Component Value Date   NA 145 05/28/2013    Lab Results  Component Value Date   CEA 3.9 05/28/2013    Imaging:  No results found.  Medications: I have reviewed the patient's current medications.  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 Cesarean section delivery of her second child 05/20/2013  3. Anemia-potentially related to pregnancy/surgical blood loss versus a malignancy   Disposition:  Laura [name] appears well today. I discuss systemic treatment options with Laura [name] and her husband. The plan is to deliver several cycles of oxaliplatin-based therapy prior to a restaging CT evaluation and consideration of HIPEC. Dr. Clovis Riley recommends oxaliplatin-based therapy. She lives almost an hour away and plans to resume her work activity if tolerated. I recommend CAPOX chemotherapy. She will also receive Avastin.  We reviewed the potential toxicities associated with the CAPOX regimen including the chance for nausea/vomiting, mucositis, diarrhea, alopecia, and hematologic toxicity. We discussed the various types of neuropathy associated with oxaliplatin. We reviewed the rash, hyperpigmentation, and hand/foot syndrome seen with capecitabine. We also discussed the hypertension, allergic reaction, bleeding, delayed wound healing, bowel perforation, nephropathy, and thromboembolic disease associated with Avastin.  Laura [name] will attend a chemotherapy teaching class. The plan is to begin a first cycle of CAPOX on 07/15/2013. She will be referred for placement of a Port-A-Cath.  Laura [name] will be scheduled for an office visit and second cycle of chemotherapy 08/05/2013.  Betsy Coder, MD  07/07/2013  4:41 PM

## 2013-07-08 ENCOUNTER — Encounter (HOSPITAL_COMMUNITY): Payer: Self-pay | Admitting: Pharmacy Technician

## 2013-07-09 ENCOUNTER — Encounter: Payer: Self-pay | Admitting: Oncology

## 2013-07-09 ENCOUNTER — Other Ambulatory Visit: Payer: Self-pay | Admitting: *Deleted

## 2013-07-09 ENCOUNTER — Telehealth: Payer: Self-pay | Admitting: *Deleted

## 2013-07-09 ENCOUNTER — Other Ambulatory Visit (HOSPITAL_BASED_OUTPATIENT_CLINIC_OR_DEPARTMENT_OTHER): Payer: BC Managed Care – PPO

## 2013-07-09 ENCOUNTER — Other Ambulatory Visit: Payer: BC Managed Care – PPO

## 2013-07-09 ENCOUNTER — Other Ambulatory Visit: Payer: Self-pay | Admitting: Radiology

## 2013-07-09 ENCOUNTER — Encounter: Payer: Self-pay | Admitting: *Deleted

## 2013-07-09 ENCOUNTER — Telehealth: Payer: Self-pay | Admitting: Oncology

## 2013-07-09 DIAGNOSIS — D649 Anemia, unspecified: Secondary | ICD-10-CM

## 2013-07-09 DIAGNOSIS — C181 Malignant neoplasm of appendix: Secondary | ICD-10-CM

## 2013-07-09 LAB — COMPREHENSIVE METABOLIC PANEL (CC13)
ALBUMIN: 3.9 g/dL (ref 3.5–5.0)
ALT: 40 U/L (ref 0–55)
ANION GAP: 8 meq/L (ref 3–11)
AST: 27 U/L (ref 5–34)
Alkaline Phosphatase: 132 U/L (ref 40–150)
BUN: 13.3 mg/dL (ref 7.0–26.0)
CALCIUM: 9.5 mg/dL (ref 8.4–10.4)
CHLORIDE: 107 meq/L (ref 98–109)
CO2: 27 meq/L (ref 22–29)
CREATININE: 1 mg/dL (ref 0.6–1.1)
Glucose: 104 mg/dl (ref 70–140)
Potassium: 4.3 mEq/L (ref 3.5–5.1)
Sodium: 142 mEq/L (ref 136–145)
TOTAL PROTEIN: 7.1 g/dL (ref 6.4–8.3)
Total Bilirubin: 0.42 mg/dL (ref 0.20–1.20)

## 2013-07-09 LAB — CBC WITH DIFFERENTIAL/PLATELET
BASO%: 0.5 % (ref 0.0–2.0)
Basophils Absolute: 0.1 10*3/uL (ref 0.0–0.1)
EOS%: 1.9 % (ref 0.0–7.0)
Eosinophils Absolute: 0.2 10*3/uL (ref 0.0–0.5)
HEMATOCRIT: 35.2 % (ref 34.8–46.6)
HEMOGLOBIN: 11.4 g/dL — AB (ref 11.6–15.9)
LYMPH#: 2 10*3/uL (ref 0.9–3.3)
LYMPH%: 20.2 % (ref 14.0–49.7)
MCH: 25.9 pg (ref 25.1–34.0)
MCHC: 32.5 g/dL (ref 31.5–36.0)
MCV: 79.7 fL (ref 79.5–101.0)
MONO#: 0.5 10*3/uL (ref 0.1–0.9)
MONO%: 4.6 % (ref 0.0–14.0)
NEUT#: 7.2 10*3/uL — ABNORMAL HIGH (ref 1.5–6.5)
NEUT%: 72.8 % (ref 38.4–76.8)
Platelets: 365 10*3/uL (ref 145–400)
RBC: 4.42 10*6/uL (ref 3.70–5.45)
RDW: 15.6 % — ABNORMAL HIGH (ref 11.2–14.5)
WBC: 9.9 10*3/uL (ref 3.9–10.3)

## 2013-07-09 MED ORDER — PROCHLORPERAZINE MALEATE 10 MG PO TABS: 10.0000 mg | ORAL_TABLET | Freq: Four times a day (QID) | ORAL | Status: DC | PRN

## 2013-07-09 MED ORDER — LIDOCAINE-PRILOCAINE 2.5-2.5 % EX CREA: 1.0000 "application " | TOPICAL_CREAM | CUTANEOUS | Status: DC | PRN

## 2013-07-09 MED ORDER — CAPECITABINE 500 MG PO TABS
1500.0000 mg | ORAL_TABLET | Freq: Two times a day (BID) | ORAL | Status: DC
Start: 2013-07-15 — End: 2013-08-14

## 2013-07-09 NOTE — Telephone Encounter (Signed)
Called pt left message regarding chemo on 4/14, advised pt to get a new calendar for May 2015 °

## 2013-07-09 NOTE — Telephone Encounter (Signed)
Received prior authorization request from [facility] for Capecitabine.  Gave form to care management.

## 2013-07-09 NOTE — Telephone Encounter (Signed)
Per staff message and POF I have scheduled appts.  JMW  

## 2013-07-10 ENCOUNTER — Encounter (HOSPITAL_COMMUNITY): Payer: Self-pay

## 2013-07-10 ENCOUNTER — Other Ambulatory Visit: Payer: Self-pay | Admitting: Oncology

## 2013-07-10 ENCOUNTER — Ambulatory Visit (HOSPITAL_COMMUNITY)
Admission: RE | Admit: 2013-07-10 | Discharge: 2013-07-10 | Disposition: A | Discharge status: Home / Self Care | Payer: BC Managed Care – PPO | Source: Ambulatory Visit | Attending: Oncology | Admitting: Oncology

## 2013-07-10 DIAGNOSIS — Z452 Encounter for adjustment and management of vascular access device: Secondary | ICD-10-CM | POA: Insufficient documentation

## 2013-07-10 DIAGNOSIS — C181 Malignant neoplasm of appendix: Secondary | ICD-10-CM

## 2013-07-10 LAB — PROTIME-INR
INR: 0.96 (ref 0.00–1.49)
Prothrombin Time: 12.6 seconds (ref 11.6–15.2)

## 2013-07-10 LAB — CBC
HCT: 34.1 % — ABNORMAL LOW (ref 36.0–46.0)
Hemoglobin: 10.8 g/dL — ABNORMAL LOW (ref 12.0–15.0)
MCH: 25.5 pg — ABNORMAL LOW (ref 26.0–34.0)
MCHC: 31.7 g/dL (ref 30.0–36.0)
MCV: 80.6 fL (ref 78.0–100.0)
Platelets: 366 10*3/uL (ref 150–400)
RBC: 4.23 MIL/uL (ref 3.87–5.11)
RDW: 14.7 % (ref 11.5–15.5)
WBC: 9.2 10*3/uL (ref 4.0–10.5)

## 2013-07-10 LAB — HCG, SERUM, QUALITATIVE: PREG SERUM: NEGATIVE

## 2013-07-10 LAB — APTT: APTT: 37 s (ref 24–37)

## 2013-07-10 MED ORDER — SODIUM CHLORIDE 0.9 % IV SOLN
Freq: Once | INTRAVENOUS | Status: AC
  Administered 2013-07-10: 12:00:00 via INTRAVENOUS

## 2013-07-10 MED ORDER — LIDOCAINE HCL 1 % IJ SOLN
INTRAMUSCULAR | Status: AC
  Filled 2013-07-10: qty 20

## 2013-07-10 MED ORDER — HEPARIN SOD (PORK) LOCK FLUSH 100 UNIT/ML IV SOLN
INTRAVENOUS | Status: AC
  Filled 2013-07-10: qty 5

## 2013-07-10 MED ORDER — MIDAZOLAM HCL 2 MG/2ML IJ SOLN
INTRAMUSCULAR | Status: AC
  Filled 2013-07-10: qty 6

## 2013-07-10 MED ORDER — CEFAZOLIN SODIUM-DEXTROSE 2-3 GM-% IV SOLR
INTRAVENOUS | Status: AC
  Administered 2013-07-10: 2 g via INTRAVENOUS
  Filled 2013-07-10: qty 50

## 2013-07-10 MED ORDER — HEPARIN SOD (PORK) LOCK FLUSH 100 UNIT/ML IV SOLN
INTRAVENOUS | Status: AC | PRN
  Administered 2013-07-10: 500 [IU]

## 2013-07-10 MED ORDER — CEFAZOLIN SODIUM-DEXTROSE 2-3 GM-% IV SOLR
2.0000 g | Freq: Once | INTRAVENOUS | Status: AC
  Administered 2013-07-10: 2 g via INTRAVENOUS

## 2013-07-10 MED ORDER — MIDAZOLAM HCL 2 MG/2ML IJ SOLN
INTRAMUSCULAR | Status: AC | PRN
  Administered 2013-07-10 (×3): 1 mg via INTRAVENOUS

## 2013-07-10 MED ORDER — FENTANYL CITRATE 0.05 MG/ML IJ SOLN
INTRAMUSCULAR | Status: AC | PRN
  Administered 2013-07-10 (×3): 25 ug via INTRAVENOUS

## 2013-07-10 MED ORDER — FENTANYL CITRATE 0.05 MG/ML IJ SOLN
INTRAMUSCULAR | Status: AC
  Filled 2013-07-10: qty 6

## 2013-07-10 NOTE — Procedures (Signed)
R IJ Port cathter placement with US and fluoroscopy No complication No blood loss. See complete dictation in [facility] PACS.  

## 2013-07-10 NOTE — Discharge Instructions (Signed)
Moderate Sedation, Adult °Moderate sedation is given to help you relax or even sleep through a procedure. You may remain sleepy, be clumsy, or have poor balance for several hours following this procedure. Arrange for a responsible adult, family member, or friend to take you home. A responsible adult should stay with you for at least 24 hours or until the medicines have worn off. °· Do not participate in any activities where you could become injured for the next 24 hours, or until you feel normal again. Do not: °· Drive. °· Swim. °· Ride a bicycle. °· Operate heavy machinery. °· Cook. °· Use power tools. °· Climb ladders. °· Work at heights. °· Do not make important decisions or sign legal documents until you are improved. °· Vomiting may occur if you eat too soon. When you can drink without vomiting, try water, juice, or soup. Try solid foods if you feel little or no nausea. °· Only take over-the-counter or prescription medications for pain, discomfort, or fever as directed by your caregiver.If pain medications have been prescribed for you, ask your caregiver how soon it is safe to take them. °· Make sure you and your family fully understands everything about the medication given to you. Make sure you understand what side effects may occur. °· You should not drink alcohol, take sleeping pills, or medications that cause drowsiness for at least 24 hours. °· If you smoke, do not smoke alone. °· If you are feeling better, you may resume normal activities 24 hours after receiving sedation. °· Keep all appointments as scheduled. Follow all instructions. °· Ask questions if you do not understand. °SEEK MEDICAL CARE IF:  °· Your skin is pale or bluish in color. °· You continue to feel sick to your stomach (nauseous) or throw up (vomit). °· Your pain is getting worse and not helped by medication. °· You have bleeding or swelling. °· You are still sleepy or feeling clumsy after 24 hours. °SEEK IMMEDIATE MEDICAL CARE IF:   °· You develop a rash. °· You have difficulty breathing. °· You develop any type of allergic problem. °· You have a fever. °Document Released: 12/13/2000 Document Revised: 06/12/2011 Document Reviewed: 11/25/2012 °ExitCare® Patient Information ©2014 [employer]. °Implanted Port Insertion, Care After °Refer to this sheet in the next few weeks. These instructions provide you with information on caring for yourself after your procedure. Your health care provider may also give you more specific instructions. Your treatment has been planned according to current medical practices, but problems sometimes occur. Call your health care provider if you have any problems or questions after your procedure. °WHAT TO EXPECT AFTER THE PROCEDURE °After your procedure, it is typical to have the following:  °· Discomfort at the port insertion site. Ice packs to the area will help. °· Bruising on the skin over the port. This will subside in 3 4 days. °HOME CARE INSTRUCTIONS °· After your port is placed, you will get a manufacturer's information card. The card has information about your port. Keep this card with you at all times.   °· Know what kind of port you have. There are many types of ports available.   °· Wear a medical alert bracelet in case of an emergency. This can help alert health care workers that you have a port.   °· The port can stay in for as long as your health care provider believes it is necessary.   °· A home health care nurse may give medicines and take care of the port.   °·   You or a family member can get special training and directions for giving medicine and taking care of the port at home.   °SEEK MEDICAL CARE IF:  °Your port does not flush or you are unable to get a blood return.    °SEEK IMMEDIATE MEDICAL CARE IF: °· You have new fluid or pus coming from your incision.   °· You notice a bad smell coming from your incision site.   °· You have swelling, pain, or more redness at the incision or port site.    °· You have a fever or chills.   °· You have chest pain or shortness of breath. °Document Released: 01/08/2013 Document Reviewed: 11/25/2012 °ExitCare® Patient Information ©2014 [employer]. ° °

## 2013-07-10 NOTE — H&P (Signed)
Chief Complaint: "I am here for a port to be placed." Referring Physician: Dr. Benay Spice HPI: Laura [name] is an 43 y.o. female who presents today for an image guided port a catheter placement for appedniceal carcinoma. She denies any chest pain, shortness of breath or palpitations. She denies any active signs of bleeding or excessive bruising. She denies any recent fever or chills. The patient denies any history of sleep apnea or chronic oxygen use. She has previously tolerated sedation without complications.    Past Medical History:  Past Medical History  Diagnosis Date  . Medical history non-contributory   . Postpartum care following cesarean delivery (05/20/13) 05/20/2013  . S/P repeat low transverse C-section 05/20/2013  . Cancer 06-05-13    Dx. 1 week ago ? "appendix cancer"    Past Surgical History:  Past Surgical History  Procedure Laterality Date  . Cesarean section    . Cesarean section N/A 05/20/2013    Procedure: CESAREAN SECTION;  Surgeon: [name], MD;  Location: [facility];  Service: Obstetrics;  Laterality: N/A;  . Colonoscopy with propofol N/A 06/12/2013    Procedure: COLONOSCOPY WITH PROPOFOL;  Surgeon: Milus Banister, MD;  Location: [facility];  Service: Endoscopy;  Laterality: N/A;    Family History: History reviewed. No pertinent family history.  Social History:  reports that she has never smoked. She has never used smokeless tobacco. She reports that she does not drink alcohol or use illicit drugs.  Allergies: No Known Allergies  Medications:   Medication List    ASK your doctor about these medications       capecitabine 500 MG tablet  Commonly known as:  XELODA  Take 3 tablets (1,500 mg total) by mouth 2 (two) times daily after a meal. X 14 days on, 7 days off  Start taking on:  07/15/2013     ibuprofen 200 MG tablet  Commonly known as:  ADVIL,MOTRIN  Take 400 mg by mouth every 6 (six) hours as needed for mild pain or moderate pain.      lidocaine-prilocaine cream  Commonly known as:  EMLA  Apply 1 application topically as needed. Apply to PAC 1-2 hours prior to stick and cover with plastic wrap     phenylephrine 10 MG Tabs tablet  Commonly known as:  SUDAFED PE  Take 10 mg by mouth daily as needed.     prochlorperazine 10 MG tablet  Commonly known as:  COMPAZINE  Take 1 tablet (10 mg total) by mouth every 6 (six) hours as needed for nausea.       Please HPI for pertinent positives, otherwise complete 10 system ROS negative.  Physical Exam: Breastfeeding? No T: 97.7 BP: 134/89 mmHg, HR: 91 bpm, O2: 100% RA  General Appearance:  Alert, cooperative, no distress  Head:  Normocephalic, without obvious abnormality, atraumatic  Nck: Supple, symmetrical, trachea midline  Lungs:   Clear to auscultation bilaterally, no w/r/r, respirations unlabored without use of accessory muscles.  Chest Wall:  No tenderness or deformity  Heart:  Regular rate and rhythm, S1, S2 normal, no murmur, rub or gallop.  Abdomen:   Soft, non-tender, non distended, (+) BS  Extremities: Extremities normal, atraumatic, no cyanosis or edema  Pulses: 2+ and symmetric  Neurologic: Normal affect, no gross deficits.   Results for orders placed during the hospital encounter of 07/10/13 (from the past 48 hour(s))  APTT     Status: None   Collection Time    07/10/13 11:30 AM  Result Value Ref Range   aPTT 37  24 - 37 seconds   Comment:            IF BASELINE aPTT IS ELEVATED,     SUGGEST PATIENT RISK ASSESSMENT     BE USED TO DETERMINE APPROPRIATE     ANTICOAGULANT THERAPY.  CBC     Status: Abnormal   Collection Time    07/10/13 11:30 AM      Result Value Ref Range   WBC 9.2  4.0 - 10.5 K/uL   RBC 4.23  3.87 - 5.11 MIL/uL   Hemoglobin 10.8 (*) 12.0 - 15.0 g/dL   HCT 34.1 (*) 36.0 - 46.0 %   MCV 80.6  78.0 - 100.0 fL   MCH 25.5 (*) 26.0 - 34.0 pg   MCHC 31.7  30.0 - 36.0 g/dL   RDW 14.7  11.5 - 15.5 %   Platelets 366  150 - 400 K/uL   PROTIME-INR     Status: None   Collection Time    07/10/13 11:30 AM      Result Value Ref Range   Prothrombin Time 12.6  11.6 - 15.2 seconds   INR 0.96  0.00 - 1.49  HCG, SERUM, QUALITATIVE     Status: None   Collection Time    07/10/13 11:30 AM      Result Value Ref Range   Preg, Serum NEGATIVE  NEGATIVE   Comment:            THE SENSITIVITY OF THIS     METHODOLOGY IS >10 mIU/mL.   No results found.  Assessment/Plan Appendiceal carcinoma.  Request for image guided port a catheter placement. Patient has been NPO, no blood thinners, afebrile, labs reviewed, ancef ordered. Risks and Benefits discussed with the patient. All of the patient's questions were answered, patient is agreeable to proceed. Consent signed and in chart.   [name] PA-C 07/10/2013, 12:44 PM

## 2013-07-11 NOTE — Telephone Encounter (Signed)
RECEIVED A FAX FROM [facility] CONCERNING A CONFIRMATION OF FACSIMILE RECEIPT FOR PT. REFERRAL. 

## 2013-07-13 ENCOUNTER — Other Ambulatory Visit: Payer: Self-pay | Admitting: Oncology

## 2013-07-14 ENCOUNTER — Telehealth: Payer: Self-pay | Admitting: *Deleted

## 2013-07-14 NOTE — Telephone Encounter (Signed)
RECEIVED A FAX FROM [facility] CONCERNING A NOTICE OF RX TRANSFER FOR XELODA TO [facility]. PHONE #[phone]/FAX #[phone].

## 2013-07-14 NOTE — Telephone Encounter (Signed)
Called and spoke with pt informing her per Dr. Benay Spice OK to get treatment tomorrow 4/14 even though she has not started her Xeloda yet; instructed to start Xeloda when delivered.  Pt verbalized understanding of instructions.

## 2013-07-14 NOTE — Telephone Encounter (Signed)
RECEIVED A FAX FROM [facility] CONCERNING A FACSIMILE RECEIPT FOR PT. REFERRAL .

## 2013-07-14 NOTE — Telephone Encounter (Signed)
Received phone call from patient stating she will not receive her Xeloda from [facility] for at least 2-3 days.  She wants to make sure Dr. Benay Spice is aware and okay with this since she is supposed to start CapeOx on 07/15/13 .  Message given to Dr. Benay Spice.

## 2013-07-15 ENCOUNTER — Other Ambulatory Visit: Payer: Self-pay | Admitting: *Deleted

## 2013-07-15 ENCOUNTER — Ambulatory Visit (HOSPITAL_BASED_OUTPATIENT_CLINIC_OR_DEPARTMENT_OTHER): Payer: BC Managed Care – PPO

## 2013-07-15 ENCOUNTER — Telehealth: Payer: Self-pay | Admitting: *Deleted

## 2013-07-15 VITALS — BP 132/64 | HR 77 | Temp 97.6°F | Resp 18

## 2013-07-15 DIAGNOSIS — C181 Malignant neoplasm of appendix: Secondary | ICD-10-CM

## 2013-07-15 DIAGNOSIS — C786 Secondary malignant neoplasm of retroperitoneum and peritoneum: Secondary | ICD-10-CM

## 2013-07-15 DIAGNOSIS — Z5111 Encounter for antineoplastic chemotherapy: Secondary | ICD-10-CM

## 2013-07-15 DIAGNOSIS — C801 Malignant (primary) neoplasm, unspecified: Secondary | ICD-10-CM

## 2013-07-15 MED ORDER — DEXAMETHASONE SODIUM PHOSPHATE 10 MG/ML IJ SOLN
10.0000 mg | Freq: Once | INTRAMUSCULAR | Status: AC
Start: 2013-07-15 — End: 2013-07-15
  Administered 2013-07-15: 10 mg via INTRAVENOUS

## 2013-07-15 MED ORDER — ONDANSETRON 8 MG/50ML IVPB (CHCC)
8.0000 mg | Freq: Once | INTRAVENOUS | Status: AC
  Administered 2013-07-15: 8 mg via INTRAVENOUS

## 2013-07-15 MED ORDER — HEPARIN SOD (PORK) LOCK FLUSH 100 UNIT/ML IV SOLN
500.0000 [IU] | Freq: Once | INTRAVENOUS | Status: AC | PRN
  Administered 2013-07-15: 500 [IU]
  Filled 2013-07-15: qty 5

## 2013-07-15 MED ORDER — OXALIPLATIN CHEMO INJECTION 100 MG/20ML
130.0000 mg/m2 | Freq: Once | INTRAVENOUS | Status: AC
  Administered 2013-07-15: 250 mg via INTRAVENOUS
  Filled 2013-07-15: qty 50

## 2013-07-15 MED ORDER — DEXTROSE 5 % IV SOLN
Freq: Once | INTRAVENOUS | Status: AC
  Administered 2013-07-15: 08:00:00 via INTRAVENOUS

## 2013-07-15 MED ORDER — ONDANSETRON 8 MG/NS 50 ML IVPB
INTRAVENOUS | Status: AC
  Filled 2013-07-15: qty 8

## 2013-07-15 MED ORDER — SODIUM CHLORIDE 0.9 % IJ SOLN
10.0000 mL | INTRAMUSCULAR | Status: DC | PRN
  Administered 2013-07-15: 10 mL
  Filled 2013-07-15: qty 10

## 2013-07-15 MED ORDER — DEXAMETHASONE SODIUM PHOSPHATE 10 MG/ML IJ SOLN
INTRAMUSCULAR | Status: AC
  Filled 2013-07-15: qty 1

## 2013-07-15 NOTE — [facility] Navigator Note (Signed)
Met with patient at first chemo visit.  She has not received her Xeloda, but is in touch with [facility] and will notify this office when it arrives.  Patient instructed, per Dr. Benay Spice, to start Xeloda once it arrives and to call RN with instructions on how long to take since did not start today as planned.  Patient verbalized understanding.  Referral made to dietician for diet education per request of patient.  Laura [name] denies any other questions at present time and understands to call if something changes.

## 2013-07-15 NOTE — Telephone Encounter (Signed)
Left VM to inquire if our office had received the PA form from [facility]? She was told by pharmacy they are waiting for our office to return this form. Forwarded message to managed care.

## 2013-07-15 NOTE — Patient Instructions (Signed)
Oxaliplatin Injection What is this medicine? OXALIPLATIN (ox AL i PLA tin) is a chemotherapy drug. It targets fast dividing cells, like cancer cells, and causes these cells to die. This medicine is used to treat cancers of the colon and rectum, and many other cancers. This medicine may be used for other purposes; ask your health care provider or pharmacist if you have questions. COMMON BRAND NAME(S): Eloxatin What should I tell my health care provider before I take this medicine? They need to know if you have any of these conditions: -kidney disease -an unusual or allergic reaction to oxaliplatin, other chemotherapy, other medicines, foods, dyes, or preservatives -pregnant or trying to get pregnant -breast-feeding How should I use this medicine? This drug is given as an infusion into a vein. It is administered in a hospital or clinic by a specially trained health care professional. Talk to your pediatrician regarding the use of this medicine in children. Special care may be needed. Overdosage: If you think you have taken too much of this medicine contact a poison control center or emergency room at once. NOTE: This medicine is only for you. Do not share this medicine with others. What if I miss a dose? It is important not to miss a dose. Call your doctor or health care professional if you are unable to keep an appointment. What may interact with this medicine? -medicines to increase blood counts like filgrastim, pegfilgrastim, sargramostim -probenecid -some antibiotics like amikacin, gentamicin, neomycin, polymyxin B, streptomycin, tobramycin -zalcitabine Talk to your doctor or health care professional before taking any of these medicines: -acetaminophen -aspirin -ibuprofen -ketoprofen -naproxen This list may not describe all possible interactions. Give your health care provider a list of all the medicines, herbs, non-prescription drugs, or dietary supplements you use. Also tell them if  you smoke, drink alcohol, or use illegal drugs. Some items may interact with your medicine. What should I watch for while using this medicine? Your condition will be monitored carefully while you are receiving this medicine. You will need important blood work done while you are taking this medicine. This medicine can make you more sensitive to cold. Do not drink cold drinks or use ice. Cover exposed skin before coming in contact with cold temperatures or cold objects. When out in cold weather wear warm clothing and cover your mouth and nose to warm the air that goes into your lungs. Tell your doctor if you get sensitive to the cold. This drug may make you feel generally unwell. This is not uncommon, as chemotherapy can affect healthy cells as well as cancer cells. Report any side effects. Continue your course of treatment even though you feel ill unless your doctor tells you to stop. In some cases, you may be given additional medicines to help with side effects. Follow all directions for their use. Call your doctor or health care professional for advice if you get a fever, chills or sore throat, or other symptoms of a cold or flu. Do not treat yourself. This drug decreases your body's ability to fight infections. Try to avoid being around people who are sick. This medicine may increase your risk to bruise or bleed. Call your doctor or health care professional if you notice any unusual bleeding. Be careful brushing and flossing your teeth or using a toothpick because you may get an infection or bleed more easily. If you have any dental work done, tell your dentist you are receiving this medicine. Avoid taking products that contain aspirin, acetaminophen, ibuprofen, naproxen,   or ketoprofen unless instructed by your doctor. These medicines may hide a fever. Do not become pregnant while taking this medicine. Women should inform their doctor if they wish to become pregnant or think they might be pregnant. There  is a potential for serious side effects to an unborn child. Talk to your health care professional or pharmacist for more information. Do not breast-feed an infant while taking this medicine. Call your doctor or health care professional if you get diarrhea. Do not treat yourself. What side effects may I notice from receiving this medicine? Side effects that you should report to your doctor or health care professional as soon as possible: -allergic reactions like skin rash, itching or hives, swelling of the face, lips, or tongue -low blood counts - This drug may decrease the number of white blood cells, red blood cells and platelets. You may be at increased risk for infections and bleeding. -signs of infection - fever or chills, cough, sore throat, pain or difficulty passing urine -signs of decreased platelets or bleeding - bruising, pinpoint red spots on the skin, black, tarry stools, nosebleeds -signs of decreased red blood cells - unusually weak or tired, fainting spells, lightheadedness -breathing problems -chest pain, pressure -cough -diarrhea -jaw tightness -mouth sores -nausea and vomiting -pain, swelling, redness or irritation at the injection site -pain, tingling, numbness in the hands or feet -problems with balance, talking, walking -redness, blistering, peeling or loosening of the skin, including inside the mouth -trouble passing urine or change in the amount of urine Side effects that usually do not require medical attention (report to your doctor or health care professional if they continue or are bothersome): -changes in vision -constipation -hair loss -loss of appetite -metallic taste in the mouth or changes in taste -stomach pain This list may not describe all possible side effects. Call your doctor for medical advice about side effects. You may report side effects to FDA at 1-800-FDA-1088. Where should I keep my medicine? This drug is given in a hospital or clinic and  will not be stored at home. NOTE: This sheet is a summary. It may not cover all possible information. If you have questions about this medicine, talk to your doctor, pharmacist, or health care provider.  2014, Elsevier/Gold Standard. (2007-10-15 17:22:47)   [facility] Discharge Instructions for Patients Receiving Chemotherapy  Today you received the following chemotherapy agents Oxaliplatin.  To help prevent nausea and vomiting after your treatment, we encourage you to take your nausea medication.   If you develop nausea and vomiting that is not controlled by your nausea medication, call the clinic.   BELOW ARE SYMPTOMS THAT SHOULD BE REPORTED IMMEDIATELY:  *FEVER GREATER THAN 100.5 F  *CHILLS WITH OR WITHOUT FEVER  NAUSEA AND VOMITING THAT IS NOT CONTROLLED WITH YOUR NAUSEA MEDICATION  *UNUSUAL SHORTNESS OF BREATH  *UNUSUAL BRUISING OR BLEEDING  TENDERNESS IN MOUTH AND THROAT WITH OR WITHOUT PRESENCE OF ULCERS  *URINARY PROBLEMS  *BOWEL PROBLEMS  UNUSUAL RASH Items with * indicate a potential emergency and should be followed up as soon as possible.  Feel free to call the clinic you have any questions or concerns. The clinic phone number is [phone].

## 2013-07-16 ENCOUNTER — Telehealth: Payer: Self-pay | Admitting: *Deleted

## 2013-07-16 NOTE — Telephone Encounter (Signed)
Message copied by Patton Salles on Wed Jul 16, 2013  2:34 PM ------      Message from: Jaci Carrel A      Created: Tue Jul 15, 2013 12:44 PM      Regarding: Chemo follow up call       First time Oxaliplatin. Dr Benay Spice. Call patient's cell.            Thanks ------

## 2013-07-16 NOTE — Telephone Encounter (Signed)
Called patient for chemo follow up. Pt denies any nausea or vomiting, having slight constipation-suggested stool softener. No further questions at this time. To call with any questions

## 2013-07-18 ENCOUNTER — Telehealth: Payer: Self-pay | Admitting: *Deleted

## 2013-07-18 NOTE — Telephone Encounter (Signed)
Patient called and stated that her Xeloda arrived and she is planning to start it tonight.  She was supposed to start on 07/15/13, but it did not arrive from [place] until today.  She wants to know if Dr. Benay Spice want her to take it for 14 days or stop on 07/29/13 as previously planned.  This message was sent to Dr. Gearldine Shown nurse who will contact patient with directions from MD.

## 2013-07-18 NOTE — Telephone Encounter (Signed)
Called pt, per Dr. Benay Spice: Complete 14 day Xeloda cycle despite beginning med D4 of chemo cycle. Pt voiced understanding.

## 2013-07-21 ENCOUNTER — Telehealth: Payer: Self-pay | Admitting: *Deleted

## 2013-07-21 ENCOUNTER — Telehealth: Payer: Self-pay | Admitting: Oncology

## 2013-07-21 NOTE — Telephone Encounter (Signed)
Asking for any precautions for her with her 53 month old getting vaccines today with her well-baby check up?  Made her aware OK for vaccines-no special precautions required. Avoidance of live vaccines are in regards to her getting one.

## 2013-07-21 NOTE — Telephone Encounter (Signed)
talked to pt, she is aware of all her appt for May 2015

## 2013-07-23 NOTE — Progress Notes (Signed)
XELODA RX SENT TO [facility].  THEY CAN'T FILL IT.  07/10/13 [phone] PER FABIEN [phone] GOOD FROM 07/15/13 TO 07/17/15.  07/11/13 RX SENT TO [facility].  THEY TRANSFERRED IT TO [facility].  07/15/13 FAX FROM [facility].  SENT IT TO [facility]IC[facility].

## 2013-08-05 ENCOUNTER — Ambulatory Visit: Payer: BC Managed Care – PPO | Admitting: Nutrition

## 2013-08-05 ENCOUNTER — Encounter: Payer: Self-pay | Admitting: *Deleted

## 2013-08-05 ENCOUNTER — Other Ambulatory Visit (HOSPITAL_BASED_OUTPATIENT_CLINIC_OR_DEPARTMENT_OTHER): Payer: BC Managed Care – PPO

## 2013-08-05 ENCOUNTER — Ambulatory Visit (HOSPITAL_BASED_OUTPATIENT_CLINIC_OR_DEPARTMENT_OTHER): Payer: BC Managed Care – PPO | Admitting: Nurse Practitioner

## 2013-08-05 ENCOUNTER — Telehealth: Payer: Self-pay | Admitting: *Deleted

## 2013-08-05 ENCOUNTER — Ambulatory Visit (HOSPITAL_BASED_OUTPATIENT_CLINIC_OR_DEPARTMENT_OTHER): Payer: BC Managed Care – PPO

## 2013-08-05 ENCOUNTER — Other Ambulatory Visit: Payer: Self-pay | Admitting: Oncology

## 2013-08-05 ENCOUNTER — Telehealth: Payer: Self-pay | Admitting: Nurse Practitioner

## 2013-08-05 VITALS — BP 136/72 | HR 76

## 2013-08-05 VITALS — BP 134/77 | HR 90 | Temp 98.3°F | Resp 18 | Ht 61.0 in | Wt 182.1 lb

## 2013-08-05 DIAGNOSIS — Z5111 Encounter for antineoplastic chemotherapy: Secondary | ICD-10-CM

## 2013-08-05 DIAGNOSIS — Z5112 Encounter for antineoplastic immunotherapy: Secondary | ICD-10-CM

## 2013-08-05 DIAGNOSIS — C801 Malignant (primary) neoplasm, unspecified: Principal | ICD-10-CM

## 2013-08-05 DIAGNOSIS — C181 Malignant neoplasm of appendix: Secondary | ICD-10-CM

## 2013-08-05 DIAGNOSIS — D649 Anemia, unspecified: Secondary | ICD-10-CM

## 2013-08-05 DIAGNOSIS — C7989 Secondary malignant neoplasm of other specified sites: Secondary | ICD-10-CM

## 2013-08-05 LAB — CBC WITH DIFFERENTIAL/PLATELET
BASO%: 0.5 % (ref 0.0–2.0)
Basophils Absolute: 0 10*3/uL (ref 0.0–0.1)
EOS%: 3 % (ref 0.0–7.0)
Eosinophils Absolute: 0.2 10*3/uL (ref 0.0–0.5)
HEMATOCRIT: 36.2 % (ref 34.8–46.6)
HGB: 11.7 g/dL (ref 11.6–15.9)
LYMPH#: 2 10*3/uL (ref 0.9–3.3)
LYMPH%: 28.1 % (ref 14.0–49.7)
MCH: 26.7 pg (ref 25.1–34.0)
MCHC: 32.3 g/dL (ref 31.5–36.0)
MCV: 82.5 fL (ref 79.5–101.0)
MONO#: 0.5 10*3/uL (ref 0.1–0.9)
MONO%: 6.7 % (ref 0.0–14.0)
NEUT%: 61.7 % (ref 38.4–76.8)
NEUTROS ABS: 4.3 10*3/uL (ref 1.5–6.5)
Platelets: 352 10*3/uL (ref 145–400)
RBC: 4.38 10*6/uL (ref 3.70–5.45)
RDW: 16.2 % — ABNORMAL HIGH (ref 11.2–14.5)
WBC: 7 10*3/uL (ref 3.9–10.3)

## 2013-08-05 LAB — COMPREHENSIVE METABOLIC PANEL (CC13)
ALT: 40 U/L (ref 0–55)
ANION GAP: 9 meq/L (ref 3–11)
AST: 32 U/L (ref 5–34)
Albumin: 3.7 g/dL (ref 3.5–5.0)
Alkaline Phosphatase: 135 U/L (ref 40–150)
BUN: 17 mg/dL (ref 7.0–26.0)
CALCIUM: 9.7 mg/dL (ref 8.4–10.4)
CHLORIDE: 106 meq/L (ref 98–109)
CO2: 29 meq/L (ref 22–29)
CREATININE: 0.9 mg/dL (ref 0.6–1.1)
Glucose: 102 mg/dl (ref 70–140)
Potassium: 4.2 mEq/L (ref 3.5–5.1)
Sodium: 144 mEq/L (ref 136–145)
TOTAL PROTEIN: 6.8 g/dL (ref 6.4–8.3)
Total Bilirubin: 0.28 mg/dL (ref 0.20–1.20)

## 2013-08-05 LAB — UA PROTEIN, DIPSTICK - CHCC: Protein, ur: NEGATIVE mg/dL

## 2013-08-05 MED ORDER — SODIUM CHLORIDE 0.9 % IJ SOLN
10.0000 mL | INTRAMUSCULAR | Status: DC | PRN
  Administered 2013-08-05: 10 mL
  Filled 2013-08-05: qty 10

## 2013-08-05 MED ORDER — DEXAMETHASONE SODIUM PHOSPHATE 10 MG/ML IJ SOLN
10.0000 mg | Freq: Once | INTRAMUSCULAR | Status: AC
  Administered 2013-08-05: 10 mg via INTRAVENOUS

## 2013-08-05 MED ORDER — SODIUM CHLORIDE 0.9 % IV SOLN
Freq: Once | INTRAVENOUS | Status: AC
  Administered 2013-08-05: 14:00:00 via INTRAVENOUS

## 2013-08-05 MED ORDER — PALONOSETRON HCL INJECTION 0.25 MG/5ML
INTRAVENOUS | Status: AC
  Filled 2013-08-05: qty 5

## 2013-08-05 MED ORDER — OXALIPLATIN CHEMO INJECTION 100 MG/20ML
130.0000 mg/m2 | Freq: Once | INTRAVENOUS | Status: AC
  Administered 2013-08-05: 250 mg via INTRAVENOUS
  Filled 2013-08-05: qty 50

## 2013-08-05 MED ORDER — SODIUM CHLORIDE 0.9 % IV SOLN
7.5000 mg/kg | Freq: Once | INTRAVENOUS | Status: AC
  Administered 2013-08-05: 625 mg via INTRAVENOUS
  Filled 2013-08-05: qty 25

## 2013-08-05 MED ORDER — DEXTROSE 5 % IV SOLN
Freq: Once | INTRAVENOUS | Status: AC
  Administered 2013-08-05: 15:00:00 via INTRAVENOUS

## 2013-08-05 MED ORDER — HEPARIN SOD (PORK) LOCK FLUSH 100 UNIT/ML IV SOLN
500.0000 [IU] | Freq: Once | INTRAVENOUS | Status: AC | PRN
  Administered 2013-08-05: 500 [IU]
  Filled 2013-08-05: qty 5

## 2013-08-05 MED ORDER — SODIUM CHLORIDE 0.9 % IV SOLN
150.0000 mg | Freq: Once | INTRAVENOUS | Status: AC
  Administered 2013-08-05: 150 mg via INTRAVENOUS
  Filled 2013-08-05: qty 5

## 2013-08-05 MED ORDER — PALONOSETRON HCL INJECTION 0.25 MG/5ML
0.2500 mg | Freq: Once | INTRAVENOUS | Status: AC
  Administered 2013-08-05: 0.25 mg via INTRAVENOUS

## 2013-08-05 MED ORDER — DEXAMETHASONE SODIUM PHOSPHATE 10 MG/ML IJ SOLN
INTRAMUSCULAR | Status: AC
  Filled 2013-08-05: qty 1

## 2013-08-05 NOTE — Telephone Encounter (Signed)
per pof to sch pt appt/sent emailt o MW to sch chemo-will call pt with sch time & date per pt req

## 2013-08-05 NOTE — Progress Notes (Signed)
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  Past medical history includes C-section on 05/20/2013.  Other history noncontributory.  Medications include Xeloda, and Compazine.  Labs were reviewed.  Height: 61 inches. Weight: 182.1 pounds. Usual body weight.  Per patient, before pregnancy with 202 pounds. BMI: 34.43.  Patient requesting information on nutrition during chemotherapy.  Patient has no food allergies or intolerances.  She is receiving oxaliplatin and experience cold sensitivity approximately one week after treatment.  She has a decreased appetite.  Patient reports nausea and diarrhea after treatment.  Nutrition diagnosis: Food and nutrition related knowledge deficit related to new diagnosis of appendiceal cancer and associated treatments as evidenced by no prior need for nutrition related information.  Intervention: Patient was educated to consume smaller, more frequent meals and snacks throughout the day, focusing on high-protein foods.  Patient educated to attempt weight maintenance during treatment.  Educated patient on food safety and strategies for increased appetite and nausea and.  Reviewed high protein diet with patient.  Provided fact sheets.  Questions were answered and teach back method used.  Contact information was provided.  Monitoring, evaluation, goals: Patient will tolerate adequate calories and protein for minimal weight loss throughout treatment.  Next visit: Wednesday, May 27, during chemotherapy.

## 2013-08-05 NOTE — Progress Notes (Signed)
[place] Work  Clinical Social Work was referred by APP for assessment of psychosocial needs.  Clinical Social Worker met with patient and patient's husband in the infusion room to offer support and assess for needs. In addition to pt's diagnosis and treatment she recently had a baby and has used some of her disability benefits through her employer.  Pt and pt's husband had questions regarding SSD, FMLA, and insurance.  CSW reviewed SSD and the application process.  CSW also discussed FMLA and importance of getting benefits information from her employer.  Pt is currently on disability through her employer for 3 more weeks and wishes to continue working as long as possible.  CSW encouraged pt to speak with her HR representative to get specifics of her benefits and disability policy.  Pt plans to contact HR to get additional information.  CSW provided contact information and encouraged pt and pt's husband to call with any additional questions or concerns.  CSw will continue to follow and support as needed.    Johnnye Lana, MSW, [place] Worker [facility] [phone]

## 2013-08-05 NOTE — Telephone Encounter (Signed)
Per staff message and POF I have scheduled appts.  JMW  

## 2013-08-05 NOTE — Progress Notes (Signed)
  [facility] OFFICE PROGRESS NOTE   Diagnosis:  Appendix carcinoma.  INTERVAL HISTORY:   [name] returns as scheduled. She completed cycle 1 CAPOX beginning 07/15/2013. She did not begin the Xeloda until 07/18/2013. She had nausea beginning day 2 lasting for about one week. She took Compazine as needed during this time. No mouth sores. She developed diarrhea on day 3 (prior to beginning Xeloda). The diarrhea lasted 3-4 days. Cold sensitivity lasted 6-7 days. No persistent neuropathy symptoms. She denies abdominal pain. No shortness of breath or chest pain. No leg swelling or calf pain. No bleeding except related to her menstrual cycle.  Objective:  Vital signs in last 24 hours:  Blood pressure 134/77, pulse 90, temperature 98.3 F (36.8 C), temperature source Oral, resp. rate 18, height 5\' 1"  (1.549 m), weight 182 lb 1.6 oz (82.6 kg), SpO2 100.00%, not currently breastfeeding.    HEENT: No thrush or ulcerations. Resp: Lungs clear. Cardio: Regular cardiac rhythm. GI: Abdomen soft and nontender. No hepatomegaly. No mass. Vascular: No leg edema.    Lab Results:  Lab Results  Component Value Date   WBC 7.0 08/05/2013   HGB 11.7 08/05/2013   HCT 36.2 08/05/2013   MCV 82.5 08/05/2013   PLT 352 08/05/2013   NEUTROABS 4.3 08/05/2013    Imaging:  No results found.  Medications: I have reviewed the patient's current medications.  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  CTs of the chest, abdomen, and pelvis on 06/02/2013 revealed an appendix mass and lymphadenopathy in the ileocecal mesentery.  Colonoscopy 06/12/2013 with a cecal/appendiceal orifice mass, status post a biopsy confirming poorly differentiated adenocarcinoma with signet ring cell features. Initiation of CAPOX 07/15/2013. 2. Cesarean section delivery of  her second child 05/20/2013  3. Anemia-potentially related to pregnancy/surgical blood loss versus a malignancy. Improved. 4. Delayed nausea following cycle 1 CAPOX. Aloxi and Emend added beginning with cycle 2.  Disposition: She appears stable. She has completed one cycle of CAPOX. Plan to proceed with cycle 2 today as scheduled.   She had delayed nausea following cycle 1. She will receive Aloxi and Emend beginning with cycle 2. She also had diarrhea during cycle 1 which predated the start of Xeloda. She will contact the office with recurrent diarrhea during cycle 2.  The plan is for 4-5 cycles of CAPOX prior to a restaging CT evaluation and consideration of HIPEC.  She will return for a followup visit and cycle 3 CAPOX on 08/27/2012. She will contact the office in the interim with any problems.  Plan reviewed with Dr. Benay Spice.    Owens Shark ANP/GNP-BC   08/05/2013  1:43 PM

## 2013-08-05 NOTE — Patient Instructions (Signed)
[facility] Discharge Instructions for Patients Receiving Chemotherapy  Today you received the following chemotherapy agents: Avastin and Oxaliplatin   To help prevent nausea and vomiting after your treatment, we encourage you to take your nausea medication as prescribed.    If you develop nausea and vomiting that is not controlled by your nausea medication, call the clinic.   BELOW ARE SYMPTOMS THAT SHOULD BE REPORTED IMMEDIATELY:  *FEVER GREATER THAN 100.5 F  *CHILLS WITH OR WITHOUT FEVER  NAUSEA AND VOMITING THAT IS NOT CONTROLLED WITH YOUR NAUSEA MEDICATION  *UNUSUAL SHORTNESS OF BREATH  *UNUSUAL BRUISING OR BLEEDING  TENDERNESS IN MOUTH AND THROAT WITH OR WITHOUT PRESENCE OF ULCERS  *URINARY PROBLEMS  *BOWEL PROBLEMS  UNUSUAL RASH Items with * indicate a potential emergency and should be followed up as soon as possible.  Feel free to call the clinic you have any questions or concerns. The clinic phone number is [phone].   Bevacizumab injection What is this medicine? BEVACIZUMAB (be va SIZ yoo mab) is a chemotherapy drug. It targets a protein found in many cancer cell types, and halts cancer growth. This drug treats many cancers including non-small cell lung cancer, and colon or rectal cancer. It is usually given with other chemotherapy drugs. This medicine may be used for other purposes; ask your health care provider or pharmacist if you have questions. COMMON BRAND NAME(S): Avastin What should I tell my health care provider before I take this medicine? They need to know if you have any of these conditions: -blood clots -heart disease, including heart failure, heart attack, or chest pain (angina) -high blood pressure -infection (especially a virus infection such as chickenpox, cold sores, or herpes) -kidney disease -lung disease -prior chemotherapy with doxorubicin, daunorubicin, epirubicin, or other anthracycline type chemotherapy  agents -recent or ongoing radiation therapy -recent surgery -stroke -an unusual or allergic reaction to bevacizumab, hamster proteins, mouse proteins, other medicines, foods, dyes, or preservatives -pregnant or trying to get pregnant -breast-feeding How should I use this medicine? This medicine is for infusion into a vein. It is given by a health care professional in a hospital or clinic setting. Talk to your pediatrician regarding the use of this medicine in children. Special care may be needed. Overdosage: If you think you have taken too much of this medicine contact a poison control center or emergency room at once. NOTE: This medicine is only for you. Do not share this medicine with others. What if I miss a dose? It is important not to miss your dose. Call your doctor or health care professional if you are unable to keep an appointment. What may interact with this medicine? Interactions are not expected. This list may not describe all possible interactions. Give your health care provider a list of all the medicines, herbs, non-prescription drugs, or dietary supplements you use. Also tell them if you smoke, drink alcohol, or use illegal drugs. Some items may interact with your medicine. What should I watch for while using this medicine? Your condition will be monitored carefully while you are receiving this medicine. You will need important blood work and urine testing done while you are taking this medicine. During your treatment, let your health care professional know if you have any unusual symptoms, such as difficulty breathing. This medicine may rarely cause 'gastrointestinal perforation' (holes in the stomach, intestines or colon), a serious side effect requiring surgery to repair. This medicine should be started at least 28 days following major surgery and the  site of the surgery should be totally healed. Check with your doctor before scheduling dental work or surgery while you are  receiving this treatment. Talk to your doctor if you have recently had surgery or if you have a wound that has not healed. Do not become pregnant while taking this medicine. Women should inform their doctor if they wish to become pregnant or think they might be pregnant. There is a potential for serious side effects to an unborn child. Talk to your health care professional or pharmacist for more information. Do not breast-feed an infant while taking this medicine. This medicine has caused ovarian failure in some women. This medicine may interfere with the ability to have a child. You should talk to your doctor or health care professional if you are concerned about your fertility. What side effects may I notice from receiving this medicine? Side effects that you should report to your doctor or health care professional as soon as possible: -allergic reactions like skin rash, itching or hives, swelling of the face, lips, or tongue -signs of infection - fever or chills, cough, sore throat, pain or trouble passing urine -signs of decreased platelets or bleeding - bruising, pinpoint red spots on the skin, black, tarry stools, nosebleeds, blood in the urine -breathing problems -changes in vision -chest pain -confusion -jaw pain, especially after dental work -mouth sores -seizures -severe abdominal pain -severe headache -sudden numbness or weakness of the face, arm or leg -swelling of legs or ankles -symptoms of a stroke: change in mental awareness, inability to talk or move one side of the body (especially in patients with lung cancer) -trouble passing urine or change in the amount of urine -trouble speaking or understanding -trouble walking, dizziness, loss of balance or coordination Side effects that usually do not require medical attention (report to your doctor or health care professional if they continue or are bothersome): -constipation -diarrhea -dry skin -headache -loss of  appetite -nausea, vomiting This list may not describe all possible side effects. Call your doctor for medical advice about side effects. You may report side effects to FDA at 1-800-FDA-1088. Where should I keep my medicine? This drug is given in a hospital or clinic and will not be stored at home. NOTE: This sheet is a summary. It may not cover all possible information. If you have questions about this medicine, talk to your doctor, pharmacist, or health care provider.  2014, Elsevier/Gold Standard. (2010-02-18 16:25:37)

## 2013-08-06 ENCOUNTER — Telehealth: Payer: Self-pay | Admitting: *Deleted

## 2013-08-06 NOTE — Telephone Encounter (Signed)
Laura [name] for chemotherapy F/U.  Patient is doing well.  Reports nausea but denies vomiting or dry heaves.  Asked of she may take anti-emetic due to being changed to Emend and aloxi.  Suggested she take compazine for breakthrough nausea.  Denies any new side effects or symptoms but continues with cold and mouth sensitivity same as with first week of oxaliplatin.  Bowel and bladder is functioning well.  Eating and drinking just not as much.  Instructed to drink 64 oz minimum daily or at least the day before, of and after treatment.  Denies questions at this time and encouraged to call if needed.  Reviewed how to call after hours in the case of an emergency.

## 2013-08-06 NOTE — Telephone Encounter (Signed)
Message copied by Cherylynn Ridges on Wed Aug 06, 2013 11:45 AM ------      Message from: Alla Feeling      Created: Tue Aug 05, 2013  2:51 PM      Regarding: chemo f/u call      Contact: [phone]       Addition of avastin; d1C2 oxaliplatin. avastin previously held d/t port placement. ------

## 2013-08-08 ENCOUNTER — Other Ambulatory Visit: Payer: BC Managed Care – PPO

## 2013-08-08 ENCOUNTER — Telehealth: Payer: Self-pay | Admitting: Oncology

## 2013-08-08 NOTE — Telephone Encounter (Signed)
per pof to sch appts/sent email to MW to sch trmt/cld & spoke w/pt to give appt time & date

## 2013-08-11 ENCOUNTER — Telehealth: Payer: Self-pay | Admitting: *Deleted

## 2013-08-11 MED ORDER — LOPERAMIDE HCL 2 MG PO CAPS: 2.0000 mg | ORAL_CAPSULE | ORAL | Status: DC | PRN

## 2013-08-11 NOTE — Telephone Encounter (Signed)
Message from pt requesting return call. Has questions about meds. Pt reports diarrhea that began D2 of this cycle of CapOx. Having 3-5 loose stools daily. Pt has not tried Imodium. Denies mouth sores or hand/ foot pain.  Reviewed with Dr. Benay Spice: Try Imodium. Take up to 8/ day. Call office if diarrhea is not better 5/12. Pt voiced understanding.

## 2013-08-14 ENCOUNTER — Other Ambulatory Visit: Payer: Self-pay | Admitting: Oncology

## 2013-08-14 NOTE — Telephone Encounter (Signed)
Waiting on call back from patient on status of diarrhea

## 2013-08-20 ENCOUNTER — Telehealth: Payer: Self-pay | Admitting: *Deleted

## 2013-08-20 NOTE — Telephone Encounter (Signed)
Pt responding to message left from office 08/19/13 of update on diarrhea.  Received message from pt that diarrhea is "much better" and confirmed she will see MD at appt on 08/27/13.

## 2013-08-27 ENCOUNTER — Ambulatory Visit: Payer: BC Managed Care – PPO | Admitting: Nutrition

## 2013-08-27 ENCOUNTER — Telehealth: Payer: Self-pay | Admitting: *Deleted

## 2013-08-27 ENCOUNTER — Telehealth: Payer: Self-pay | Admitting: Oncology

## 2013-08-27 ENCOUNTER — Ambulatory Visit (HOSPITAL_BASED_OUTPATIENT_CLINIC_OR_DEPARTMENT_OTHER): Payer: BC Managed Care – PPO

## 2013-08-27 ENCOUNTER — Other Ambulatory Visit (HOSPITAL_BASED_OUTPATIENT_CLINIC_OR_DEPARTMENT_OTHER): Payer: BC Managed Care – PPO

## 2013-08-27 ENCOUNTER — Ambulatory Visit (HOSPITAL_BASED_OUTPATIENT_CLINIC_OR_DEPARTMENT_OTHER): Payer: BC Managed Care – PPO | Admitting: Oncology

## 2013-08-27 ENCOUNTER — Other Ambulatory Visit: Payer: Self-pay | Admitting: Oncology

## 2013-08-27 VITALS — BP 133/78 | HR 91 | Temp 97.9°F | Resp 18 | Ht 61.0 in | Wt 181.9 lb

## 2013-08-27 DIAGNOSIS — C801 Malignant (primary) neoplasm, unspecified: Principal | ICD-10-CM

## 2013-08-27 DIAGNOSIS — Z5111 Encounter for antineoplastic chemotherapy: Secondary | ICD-10-CM

## 2013-08-27 DIAGNOSIS — C181 Malignant neoplasm of appendix: Secondary | ICD-10-CM

## 2013-08-27 DIAGNOSIS — C7989 Secondary malignant neoplasm of other specified sites: Secondary | ICD-10-CM

## 2013-08-27 DIAGNOSIS — Z5112 Encounter for antineoplastic immunotherapy: Secondary | ICD-10-CM

## 2013-08-27 DIAGNOSIS — D649 Anemia, unspecified: Secondary | ICD-10-CM

## 2013-08-27 LAB — COMPREHENSIVE METABOLIC PANEL (CC13)
ALK PHOS: 198 U/L — AB (ref 40–150)
ALT: 120 U/L — ABNORMAL HIGH (ref 0–55)
AST: 124 U/L — ABNORMAL HIGH (ref 5–34)
Albumin: 3.5 g/dL (ref 3.5–5.0)
Anion Gap: 9 mEq/L (ref 3–11)
BUN: 14.4 mg/dL (ref 7.0–26.0)
CO2: 27 mEq/L (ref 22–29)
Calcium: 9.3 mg/dL (ref 8.4–10.4)
Chloride: 109 mEq/L (ref 98–109)
Creatinine: 0.8 mg/dL (ref 0.6–1.1)
Glucose: 96 mg/dl (ref 70–140)
Potassium: 4.6 mEq/L (ref 3.5–5.1)
SODIUM: 145 meq/L (ref 136–145)
Total Bilirubin: 0.39 mg/dL (ref 0.20–1.20)
Total Protein: 6.6 g/dL (ref 6.4–8.3)

## 2013-08-27 LAB — CBC WITH DIFFERENTIAL/PLATELET
BASO%: 0.5 % (ref 0.0–2.0)
Basophils Absolute: 0 10*3/uL (ref 0.0–0.1)
EOS ABS: 0.1 10*3/uL (ref 0.0–0.5)
EOS%: 2.6 % (ref 0.0–7.0)
HCT: 37.3 % (ref 34.8–46.6)
HGB: 12.1 g/dL (ref 11.6–15.9)
LYMPH#: 1.4 10*3/uL (ref 0.9–3.3)
LYMPH%: 30.6 % (ref 14.0–49.7)
MCH: 28 pg (ref 25.1–34.0)
MCHC: 32.4 g/dL (ref 31.5–36.0)
MCV: 86.2 fL (ref 79.5–101.0)
MONO#: 0.7 10*3/uL (ref 0.1–0.9)
MONO%: 15.7 % — AB (ref 0.0–14.0)
NEUT%: 50.6 % (ref 38.4–76.8)
NEUTROS ABS: 2.3 10*3/uL (ref 1.5–6.5)
Platelets: 243 10*3/uL (ref 145–400)
RBC: 4.32 10*6/uL (ref 3.70–5.45)
RDW: 24.3 % — AB (ref 11.2–14.5)
WBC: 4.6 10*3/uL (ref 3.9–10.3)

## 2013-08-27 MED ORDER — DEXTROSE 5 % IV SOLN
Freq: Once | INTRAVENOUS | Status: AC
  Administered 2013-08-27: 13:00:00 via INTRAVENOUS

## 2013-08-27 MED ORDER — DEXAMETHASONE SODIUM PHOSPHATE 10 MG/ML IJ SOLN
10.0000 mg | Freq: Once | INTRAMUSCULAR | Status: AC
  Administered 2013-08-27: 10 mg via INTRAVENOUS

## 2013-08-27 MED ORDER — DEXAMETHASONE SODIUM PHOSPHATE 10 MG/ML IJ SOLN
INTRAMUSCULAR | Status: AC
  Filled 2013-08-27: qty 1

## 2013-08-27 MED ORDER — SODIUM CHLORIDE 0.9 % IJ SOLN
10.0000 mL | INTRAMUSCULAR | Status: DC | PRN
  Administered 2013-08-27: 10 mL
  Filled 2013-08-27: qty 10

## 2013-08-27 MED ORDER — SODIUM CHLORIDE 0.9 % IV SOLN
Freq: Once | INTRAVENOUS | Status: AC
  Administered 2013-08-27: 11:00:00 via INTRAVENOUS

## 2013-08-27 MED ORDER — SODIUM CHLORIDE 0.9 % IV SOLN
150.0000 mg | Freq: Once | INTRAVENOUS | Status: AC
  Administered 2013-08-27: 150 mg via INTRAVENOUS
  Filled 2013-08-27: qty 5

## 2013-08-27 MED ORDER — SODIUM CHLORIDE 0.9 % IV SOLN
7.5000 mg/kg | Freq: Once | INTRAVENOUS | Status: AC
  Administered 2013-08-27: 625 mg via INTRAVENOUS
  Filled 2013-08-27: qty 25

## 2013-08-27 MED ORDER — PALONOSETRON HCL INJECTION 0.25 MG/5ML
0.2500 mg | Freq: Once | INTRAVENOUS | Status: AC
  Administered 2013-08-27: 0.25 mg via INTRAVENOUS

## 2013-08-27 MED ORDER — PALONOSETRON HCL INJECTION 0.25 MG/5ML
INTRAVENOUS | Status: AC
  Filled 2013-08-27: qty 5

## 2013-08-27 MED ORDER — OXALIPLATIN CHEMO INJECTION 100 MG/20ML
130.0000 mg/m2 | Freq: Once | INTRAVENOUS | Status: AC
  Administered 2013-08-27: 250 mg via INTRAVENOUS
  Filled 2013-08-27: qty 50

## 2013-08-27 MED ORDER — HEPARIN SOD (PORK) LOCK FLUSH 100 UNIT/ML IV SOLN
500.0000 [IU] | Freq: Once | INTRAVENOUS | Status: AC | PRN
  Administered 2013-08-27: 500 [IU]
  Filled 2013-08-27: qty 5

## 2013-08-27 NOTE — [facility] Oncology Navigator Note (Signed)
Met with patient during clinic visit to assess for needs.  She stated she has returned to work.  She stated she tires easily, but has plenty of support to care for self and her little children.  Her husband is with her and is supportive.  She stated her mother and sister are also assisting her as needed.  She acknowledged that her employer had received the necessary forms to return to work and for [employer].  She denies need for services at present time, but stated she would call if things change.  Will continue to follow.

## 2013-08-27 NOTE — Progress Notes (Signed)
Pt saw Dr. Benay Spice prior to chemo today.  Proceed with chemo as per md after reviewing all lab results.

## 2013-08-27 NOTE — Progress Notes (Signed)
  [facility] OFFICE PROGRESS NOTE   Diagnosis: Appendiceal carcinoma  INTERVAL HISTORY:   She returns as scheduled. She completed a second cycle of CAPOX beginning 08/05/2013. She reports cold sensitivity for 8-9 days following chemotherapy. No neuropathy symptoms at present. Diarrhea began on day 2 and lasted for several days. The diarrhea improved with Imodium. No mouth sores or nausea/vomiting. No hand/foot pain. No symptoms of venous or arterial thrombosis. No bleeding.  Objective:  Vital signs in last 24 hours:  Blood pressure 133/78, pulse 91, temperature 97.9 F (36.6 C), temperature source Oral, resp. rate 18, height 5\' 1"  (1.549 m), weight 181 lb 14.4 oz (82.509 kg), not currently breastfeeding.    HEENT: No thrush or ulcer Resp: Lungs clear bilaterally Cardio: Regular rate and rhythm GI: No hepatomegaly, nontender, no mass Vascular: No leg edema Neuro: The vibratory sense is intact at the fingertips bilaterally  Skin: Palms and soles without erythema or skin breakdown   Portacath/PICC-without erythema  Lab Results:  Lab Results  Component Value Date   WBC 4.6 08/27/2013   HGB 12.1 08/27/2013   HCT 37.3 08/27/2013   MCV 86.2 08/27/2013   PLT 243 08/27/2013   NEUTROABS 2.3 08/27/2013     Medications: I have reviewed the patient's current medications.  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  CTs of the chest, abdomen, and pelvis on 06/02/2013 revealed an appendix mass and lymphadenopathy in the ileocecal mesentery.  Colonoscopy 06/12/2013 with a cecal/appendiceal orifice mass, status post a biopsy confirming poorly differentiated adenocarcinoma with signet ring cell features.  Initiation of CAPOX 07/15/2013. 2. Cesarean section delivery of her second child 05/20/2013  3. Anemia-potentially  related to pregnancy/surgical blood loss versus a malignancy. Improved.  4. Delayed nausea following cycle 1 CAPOX. Aloxi and Emend added beginning with cycle 2.    Disposition:  She appears to be tolerating the chemotherapy well. The plan is to proceed with cycle 3 of adjuvant CAPOX today. She will contact us for increased diarrhea following this cycle of chemotherapy. Mr. Brockel will return for an office visit and cycle 4 CAPOX 09/16/2013.  Ladell Pier, MD  08/27/2013  10:10 AM

## 2013-08-27 NOTE — Progress Notes (Signed)
Nutrition followup completed with patient receiving chemotherapy for appendix cancer.  Patient's weight is relatively stable and was documented as 181.9 pounds on May 27.  Patient denies nausea.  She did describe diarrhea for approximately 6 days after treatment but says Imodium, resolved. Patient voices no other nutrition concerns or needs.  Nutrition diagnosis: Food and nutrition related knowledge deficit improved.  Intervention: Patient educated to continue strategies for adequate calories and protein in small, frequent meals and snacks. Patient educated to continue weight maintenance. Questions were answered.  Teach back method used.  Monitoring, evaluation, goals: Patient is tolerating adequate calories and protein for weight maintenance.  Next visit: Tuesday, June 16, during chemotherapy.

## 2013-08-27 NOTE — Telephone Encounter (Signed)
lmonvm of the pt regarding a scheduling d/t change on her June appts.

## 2013-08-27 NOTE — Patient Instructions (Signed)
[facility] Discharge Instructions for Patients Receiving Chemotherapy  Today you received the following chemotherapy agents: Avastin, Oxaliplatin  To help prevent nausea and vomiting after your treatment, we encourage you to take your nausea medication as prescribed by your physician.    If you develop nausea and vomiting that is not controlled by your nausea medication, call the clinic.   BELOW ARE SYMPTOMS THAT SHOULD BE REPORTED IMMEDIATELY:  *FEVER GREATER THAN 100.5 F  *CHILLS WITH OR WITHOUT FEVER  NAUSEA AND VOMITING THAT IS NOT CONTROLLED WITH YOUR NAUSEA MEDICATION  *UNUSUAL SHORTNESS OF BREATH  *UNUSUAL BRUISING OR BLEEDING  TENDERNESS IN MOUTH AND THROAT WITH OR WITHOUT PRESENCE OF ULCERS  *URINARY PROBLEMS  *BOWEL PROBLEMS  UNUSUAL RASH Items with * indicate a potential emergency and should be followed up as soon as possible.  Feel free to call the clinic you have any questions or concerns. The clinic phone number is [phone].    

## 2013-08-27 NOTE — Telephone Encounter (Signed)
I have adjusted 6/16 appt 

## 2013-09-10 ENCOUNTER — Other Ambulatory Visit: Payer: Self-pay | Admitting: Oncology

## 2013-09-14 ENCOUNTER — Other Ambulatory Visit: Payer: Self-pay | Admitting: Oncology

## 2013-09-16 ENCOUNTER — Encounter: Payer: BC Managed Care – PPO | Admitting: Nutrition

## 2013-09-16 ENCOUNTER — Telehealth: Payer: Self-pay | Admitting: Oncology

## 2013-09-16 ENCOUNTER — Ambulatory Visit (HOSPITAL_BASED_OUTPATIENT_CLINIC_OR_DEPARTMENT_OTHER): Payer: BC Managed Care – PPO

## 2013-09-16 ENCOUNTER — Ambulatory Visit (HOSPITAL_BASED_OUTPATIENT_CLINIC_OR_DEPARTMENT_OTHER): Payer: BC Managed Care – PPO | Admitting: Nurse Practitioner

## 2013-09-16 ENCOUNTER — Ambulatory Visit: Payer: BC Managed Care – PPO | Admitting: Nutrition

## 2013-09-16 ENCOUNTER — Other Ambulatory Visit (HOSPITAL_BASED_OUTPATIENT_CLINIC_OR_DEPARTMENT_OTHER): Payer: BC Managed Care – PPO

## 2013-09-16 VITALS — BP 153/81 | HR 90 | Temp 97.9°F | Resp 18 | Ht 61.0 in | Wt 183.5 lb

## 2013-09-16 DIAGNOSIS — R7989 Other specified abnormal findings of blood chemistry: Secondary | ICD-10-CM

## 2013-09-16 DIAGNOSIS — C7989 Secondary malignant neoplasm of other specified sites: Secondary | ICD-10-CM

## 2013-09-16 DIAGNOSIS — D649 Anemia, unspecified: Secondary | ICD-10-CM

## 2013-09-16 DIAGNOSIS — C801 Malignant (primary) neoplasm, unspecified: Principal | ICD-10-CM

## 2013-09-16 DIAGNOSIS — Z5111 Encounter for antineoplastic chemotherapy: Secondary | ICD-10-CM

## 2013-09-16 DIAGNOSIS — C181 Malignant neoplasm of appendix: Secondary | ICD-10-CM

## 2013-09-16 DIAGNOSIS — R110 Nausea: Secondary | ICD-10-CM

## 2013-09-16 DIAGNOSIS — Z5112 Encounter for antineoplastic immunotherapy: Secondary | ICD-10-CM

## 2013-09-16 LAB — COMPREHENSIVE METABOLIC PANEL (CC13)
ALBUMIN: 3.7 g/dL (ref 3.5–5.0)
ALK PHOS: 189 U/L — AB (ref 40–150)
ALT: 117 U/L — ABNORMAL HIGH (ref 0–55)
AST: 150 U/L — ABNORMAL HIGH (ref 5–34)
Anion Gap: 7 mEq/L (ref 3–11)
BUN: 14.1 mg/dL (ref 7.0–26.0)
CO2: 27 meq/L (ref 22–29)
Calcium: 9.3 mg/dL (ref 8.4–10.4)
Chloride: 109 mEq/L (ref 98–109)
Creatinine: 0.8 mg/dL (ref 0.6–1.1)
GLUCOSE: 95 mg/dL (ref 70–140)
POTASSIUM: 4.2 meq/L (ref 3.5–5.1)
Sodium: 143 mEq/L (ref 136–145)
TOTAL PROTEIN: 6.9 g/dL (ref 6.4–8.3)
Total Bilirubin: 0.43 mg/dL (ref 0.20–1.20)

## 2013-09-16 LAB — CBC WITH DIFFERENTIAL/PLATELET
BASO%: 1 % (ref 0.0–2.0)
Basophils Absolute: 0.1 10*3/uL (ref 0.0–0.1)
EOS ABS: 0.1 10*3/uL (ref 0.0–0.5)
EOS%: 2.8 % (ref 0.0–7.0)
HCT: 37.6 % (ref 34.8–46.6)
HGB: 12.3 g/dL (ref 11.6–15.9)
LYMPH%: 32.7 % (ref 14.0–49.7)
MCH: 29.6 pg (ref 25.1–34.0)
MCHC: 32.7 g/dL (ref 31.5–36.0)
MCV: 90.4 fL (ref 79.5–101.0)
MONO#: 0.7 10*3/uL (ref 0.1–0.9)
MONO%: 14 % (ref 0.0–14.0)
NEUT#: 2.6 10*3/uL (ref 1.5–6.5)
NEUT%: 49.5 % (ref 38.4–76.8)
Platelets: 244 10*3/uL (ref 145–400)
RBC: 4.16 10*6/uL (ref 3.70–5.45)
RDW: 27.2 % — AB (ref 11.2–14.5)
WBC: 5.2 10*3/uL (ref 3.9–10.3)
lymph#: 1.7 10*3/uL (ref 0.9–3.3)

## 2013-09-16 LAB — UA PROTEIN, DIPSTICK - CHCC: PROTEIN: NEGATIVE mg/dL

## 2013-09-16 MED ORDER — SODIUM CHLORIDE 0.9 % IV SOLN
150.0000 mg | Freq: Once | INTRAVENOUS | Status: AC
  Administered 2013-09-16: 150 mg via INTRAVENOUS
  Filled 2013-09-16: qty 5

## 2013-09-16 MED ORDER — DEXAMETHASONE SODIUM PHOSPHATE 10 MG/ML IJ SOLN
INTRAMUSCULAR | Status: AC
Start: 2013-09-16 — End: 2013-09-16
  Filled 2013-09-16: qty 1

## 2013-09-16 MED ORDER — PALONOSETRON HCL INJECTION 0.25 MG/5ML
0.2500 mg | Freq: Once | INTRAVENOUS | Status: AC
  Administered 2013-09-16: 0.25 mg via INTRAVENOUS

## 2013-09-16 MED ORDER — SODIUM CHLORIDE 0.9 % IJ SOLN
10.0000 mL | INTRAMUSCULAR | Status: DC | PRN
  Administered 2013-09-16: 10 mL
  Filled 2013-09-16: qty 10

## 2013-09-16 MED ORDER — DEXTROSE 5 % IV SOLN
Freq: Once | INTRAVENOUS | Status: AC
  Administered 2013-09-16: 12:00:00 via INTRAVENOUS

## 2013-09-16 MED ORDER — DEXAMETHASONE SODIUM PHOSPHATE 10 MG/ML IJ SOLN
10.0000 mg | Freq: Once | INTRAMUSCULAR | Status: AC
  Administered 2013-09-16: 10 mg via INTRAVENOUS

## 2013-09-16 MED ORDER — PALONOSETRON HCL INJECTION 0.25 MG/5ML
INTRAVENOUS | Status: AC
  Filled 2013-09-16: qty 5

## 2013-09-16 MED ORDER — SODIUM CHLORIDE 0.9 % IV SOLN
7.5000 mg/kg | Freq: Once | INTRAVENOUS | Status: AC
  Administered 2013-09-16: 625 mg via INTRAVENOUS
  Filled 2013-09-16: qty 25

## 2013-09-16 MED ORDER — OXALIPLATIN CHEMO INJECTION 100 MG/20ML
130.0000 mg/m2 | Freq: Once | INTRAVENOUS | Status: AC
  Administered 2013-09-16: 250 mg via INTRAVENOUS
  Filled 2013-09-16: qty 50

## 2013-09-16 MED ORDER — SODIUM CHLORIDE 0.9 % IV SOLN
Freq: Once | INTRAVENOUS | Status: AC
  Administered 2013-09-16: 11:00:00 via INTRAVENOUS

## 2013-09-16 MED ORDER — HEPARIN SOD (PORK) LOCK FLUSH 100 UNIT/ML IV SOLN
500.0000 [IU] | Freq: Once | INTRAVENOUS | Status: AC | PRN
  Administered 2013-09-16: 500 [IU]
  Filled 2013-09-16: qty 5

## 2013-09-16 NOTE — Progress Notes (Signed)
  [facility] OFFICE PROGRESS NOTE   Diagnosis:  Appendiceal carcinoma.  INTERVAL HISTORY:   Laura [name] returns as scheduled. She completed cycle 3 CAPOX beginning 08/27/2013. She had less nausea with cycle 3. No mouth sores. She had loose stools for a few days. The cold sensitivity lasted 8-10 days. No persistent neuropathy symptoms. She notes her hands have mild erythema. No pain or skin breakdown. She denies bleeding. No abdominal pain. She denies shortness of breath and chest pain. No leg swelling or calf pain. Objective:  Vital signs in last 24 hours:  Blood pressure 153/81, pulse 90, temperature 97.9 F (36.6 C), temperature source Oral, resp. rate 18, height 5\' 1"  (1.549 m), weight 183 lb 8 oz (83.235 kg), SpO2 100.00%, not currently breastfeeding.    HEENT: No thrush or ulcerations. Resp: Lungs clear. Cardio: Regular cardiac rhythm. GI: Abdomen soft and nontender. No hepatomegaly. No mass. Vascular: No leg edema. Calves nontender. Neuro: Vibratory sense mildly decreased over the fingertips per tuning fork exam.  Skin: Palms with minimal erythema. No skin breakdown.    Lab Results:  Lab Results  Component Value Date   WBC 5.2 09/16/2013   HGB 12.3 09/16/2013   HCT 37.6 09/16/2013   MCV 90.4 09/16/2013   PLT 244 09/16/2013   NEUTROABS 2.6 09/16/2013    Imaging:  No results found.  Medications: I have reviewed the patient's current medications.  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  CTs of the chest, abdomen, and pelvis on 06/02/2013 revealed an appendix mass and lymphadenopathy in the ileocecal mesentery.  Colonoscopy 06/12/2013 with a cecal/appendiceal orifice mass, status post a biopsy confirming poorly differentiated adenocarcinoma with signet ring cell features.  Initiation of CAPOX  07/15/2013. 2. Cesarean section delivery of her second child 05/20/2013  3. Anemia-potentially related to pregnancy/surgical blood loss versus a malignancy. Improved.  4. Delayed nausea following cycle 1 CAPOX. Aloxi and Emend added beginning with cycle 2.  5. Elevated liver enzymes 08/27/2013. Stable elevation 09/16/2013.    Disposition: [name] appears stable. Plan to proceed with cycle 4 CAPOX today as scheduled.   We discussed the elevated liver enzymes with pharmacy. No dose adjustment required.   She will return for a followup visit and cycle 5 in 3 weeks. She will contact the office in the interim with any problems.  Plan reviewed with Dr. Benay Spice.    Ned Card ANP/GNP-BC   09/16/2013  10:34 AM

## 2013-09-16 NOTE — Patient Instructions (Signed)
[facility] Discharge Instructions for Patients Receiving Chemotherapy  Today you received the following chemotherapy agents Oxaliplatin and Avastin.  To help prevent nausea and vomiting after your treatment, we encourage you to take your nausea medication as directed.    If you develop nausea and vomiting that is not controlled by your nausea medication, call the clinic.   BELOW ARE SYMPTOMS THAT SHOULD BE REPORTED IMMEDIATELY:  *FEVER GREATER THAN 100.5 F  *CHILLS WITH OR WITHOUT FEVER  NAUSEA AND VOMITING THAT IS NOT CONTROLLED WITH YOUR NAUSEA MEDICATION  *UNUSUAL SHORTNESS OF BREATH  *UNUSUAL BRUISING OR BLEEDING  TENDERNESS IN MOUTH AND THROAT WITH OR WITHOUT PRESENCE OF ULCERS  *URINARY PROBLEMS  *BOWEL PROBLEMS  UNUSUAL RASH Items with * indicate a potential emergency and should be followed up as soon as possible.  Feel free to call the clinic you have any questions or concerns. The clinic phone number is [phone].

## 2013-09-16 NOTE — Telephone Encounter (Signed)
gv and printed appt sched and avs fo ropt for July...sed added tx.

## 2013-09-16 NOTE — Progress Notes (Signed)
Brief nutrition followup completed with patient.  Her weight is stable and was documented as 183.5 pounds on June 16.  She continues to have metallic taste but this is improved when she follows strategies provided to her for taste alterations.  She has no other nutrition concerns or needs.    Nutrition diagnosis: Food and nutrition related knowledge deficit resolved.  Patient encouraged to continue strategies for adequate oral intake to promote weight maintenance.  Patient to contact dietitian if needed.  She has contact information.

## 2013-10-01 ENCOUNTER — Other Ambulatory Visit: Payer: Self-pay | Admitting: *Deleted

## 2013-10-01 MED ORDER — CAPECITABINE 500 MG PO TABS: 1500.0000 mg | ORAL_TABLET | Freq: Two times a day (BID) | ORAL | Status: DC

## 2013-10-04 ENCOUNTER — Other Ambulatory Visit: Payer: Self-pay | Admitting: Oncology

## 2013-10-07 ENCOUNTER — Ambulatory Visit (HOSPITAL_BASED_OUTPATIENT_CLINIC_OR_DEPARTMENT_OTHER): Payer: BC Managed Care – PPO

## 2013-10-07 ENCOUNTER — Telehealth: Payer: Self-pay | Admitting: Oncology

## 2013-10-07 ENCOUNTER — Other Ambulatory Visit (HOSPITAL_BASED_OUTPATIENT_CLINIC_OR_DEPARTMENT_OTHER): Payer: BC Managed Care – PPO

## 2013-10-07 ENCOUNTER — Ambulatory Visit (HOSPITAL_BASED_OUTPATIENT_CLINIC_OR_DEPARTMENT_OTHER): Payer: BC Managed Care – PPO | Admitting: Nurse Practitioner

## 2013-10-07 VITALS — BP 150/88 | HR 79 | Temp 97.9°F | Ht 61.0 in | Wt 184.9 lb

## 2013-10-07 DIAGNOSIS — C181 Malignant neoplasm of appendix: Secondary | ICD-10-CM

## 2013-10-07 DIAGNOSIS — Z5112 Encounter for antineoplastic immunotherapy: Secondary | ICD-10-CM

## 2013-10-07 DIAGNOSIS — C7989 Secondary malignant neoplasm of other specified sites: Secondary | ICD-10-CM

## 2013-10-07 DIAGNOSIS — G620 Drug-induced polyneuropathy: Secondary | ICD-10-CM

## 2013-10-07 DIAGNOSIS — C801 Malignant (primary) neoplasm, unspecified: Principal | ICD-10-CM

## 2013-10-07 DIAGNOSIS — Z5111 Encounter for antineoplastic chemotherapy: Secondary | ICD-10-CM

## 2013-10-07 DIAGNOSIS — C786 Secondary malignant neoplasm of retroperitoneum and peritoneum: Secondary | ICD-10-CM

## 2013-10-07 DIAGNOSIS — T451X5A Adverse effect of antineoplastic and immunosuppressive drugs, initial encounter: Secondary | ICD-10-CM

## 2013-10-07 LAB — COMPREHENSIVE METABOLIC PANEL (CC13)
ALT: 70 U/L — ABNORMAL HIGH (ref 0–55)
ANION GAP: 7 meq/L (ref 3–11)
AST: 60 U/L — AB (ref 5–34)
Albumin: 3.3 g/dL — ABNORMAL LOW (ref 3.5–5.0)
Alkaline Phosphatase: 157 U/L — ABNORMAL HIGH (ref 40–150)
BILIRUBIN TOTAL: 0.34 mg/dL (ref 0.20–1.20)
BUN: 12 mg/dL (ref 7.0–26.0)
CALCIUM: 9.1 mg/dL (ref 8.4–10.4)
CO2: 25 meq/L (ref 22–29)
CREATININE: 0.8 mg/dL (ref 0.6–1.1)
Chloride: 110 mEq/L — ABNORMAL HIGH (ref 98–109)
Glucose: 88 mg/dl (ref 70–140)
Potassium: 4.7 mEq/L (ref 3.5–5.1)
Sodium: 142 mEq/L (ref 136–145)
Total Protein: 6.3 g/dL — ABNORMAL LOW (ref 6.4–8.3)

## 2013-10-07 LAB — CBC WITH DIFFERENTIAL/PLATELET
BASO%: 0.7 % (ref 0.0–2.0)
Basophils Absolute: 0 10*3/uL (ref 0.0–0.1)
EOS%: 2.8 % (ref 0.0–7.0)
Eosinophils Absolute: 0.1 10*3/uL (ref 0.0–0.5)
HEMATOCRIT: 36.3 % (ref 34.8–46.6)
HGB: 11.9 g/dL (ref 11.6–15.9)
LYMPH%: 33.8 % (ref 14.0–49.7)
MCH: 31.4 pg (ref 25.1–34.0)
MCHC: 32.7 g/dL (ref 31.5–36.0)
MCV: 96 fL (ref 79.5–101.0)
MONO#: 0.6 10*3/uL (ref 0.1–0.9)
MONO%: 13.8 % (ref 0.0–14.0)
NEUT#: 2.1 10*3/uL (ref 1.5–6.5)
NEUT%: 48.9 % (ref 38.4–76.8)
PLATELETS: 134 10*3/uL — AB (ref 145–400)
RBC: 3.78 10*6/uL (ref 3.70–5.45)
RDW: 26.8 % — ABNORMAL HIGH (ref 11.2–14.5)
WBC: 4.4 10*3/uL (ref 3.9–10.3)
lymph#: 1.5 10*3/uL (ref 0.9–3.3)

## 2013-10-07 LAB — UA PROTEIN, DIPSTICK - CHCC: Protein, ur: <30 mg/dL

## 2013-10-07 MED ORDER — DEXAMETHASONE SODIUM PHOSPHATE 10 MG/ML IJ SOLN
10.0000 mg | Freq: Once | INTRAMUSCULAR | Status: AC
  Administered 2013-10-07: 10 mg via INTRAVENOUS

## 2013-10-07 MED ORDER — SODIUM CHLORIDE 0.9 % IV SOLN
7.5000 mg/kg | Freq: Once | INTRAVENOUS | Status: AC
  Administered 2013-10-07: 625 mg via INTRAVENOUS
  Filled 2013-10-07: qty 25

## 2013-10-07 MED ORDER — SODIUM CHLORIDE 0.9 % IJ SOLN
10.0000 mL | INTRAMUSCULAR | Status: DC | PRN
Start: 2013-10-07 — End: 2013-10-07
  Administered 2013-10-07: 10 mL
  Filled 2013-10-07: qty 10

## 2013-10-07 MED ORDER — PALONOSETRON HCL INJECTION 0.25 MG/5ML
0.2500 mg | Freq: Once | INTRAVENOUS | Status: AC
  Administered 2013-10-07: 0.25 mg via INTRAVENOUS

## 2013-10-07 MED ORDER — SODIUM CHLORIDE 0.9 % IV SOLN
150.0000 mg | Freq: Once | INTRAVENOUS | Status: AC
  Administered 2013-10-07: 150 mg via INTRAVENOUS
  Filled 2013-10-07: qty 5

## 2013-10-07 MED ORDER — DEXAMETHASONE SODIUM PHOSPHATE 20 MG/5ML IJ SOLN
INTRAMUSCULAR | Status: AC
  Filled 2013-10-07: qty 5

## 2013-10-07 MED ORDER — DEXTROSE 5 % IV SOLN
Freq: Once | INTRAVENOUS | Status: AC
  Administered 2013-10-07: 14:00:00 via INTRAVENOUS

## 2013-10-07 MED ORDER — HEPARIN SOD (PORK) LOCK FLUSH 100 UNIT/ML IV SOLN
500.0000 [IU] | Freq: Once | INTRAVENOUS | Status: AC | PRN
  Administered 2013-10-07: 500 [IU]
  Filled 2013-10-07: qty 5

## 2013-10-07 MED ORDER — DEXTROSE 5 % IV SOLN
130.0000 mg/m2 | Freq: Once | INTRAVENOUS | Status: AC
  Administered 2013-10-07: 250 mg via INTRAVENOUS
  Filled 2013-10-07: qty 50

## 2013-10-07 MED ORDER — SODIUM CHLORIDE 0.9 % IV SOLN
Freq: Once | INTRAVENOUS | Status: AC
  Administered 2013-10-07: 13:00:00 via INTRAVENOUS

## 2013-10-07 MED ORDER — PALONOSETRON HCL INJECTION 0.25 MG/5ML
INTRAVENOUS | Status: AC
  Filled 2013-10-07: qty 5

## 2013-10-07 NOTE — [facility] Oncology Navigator Note (Signed)
Met with patient during infusion treatment.  She is still working full-time.  She stated her appetite has been a little better and that she has gained a pound or two.  She is scheduled to see Dr. Clovis Riley at [facility] in August and is anxious re: next steps.  This RN offered emotional support and asked if she would like to see SW.  She declined at this time and stated she has really good support at home.  No other needs identified at this time.  Will continue to follow.

## 2013-10-07 NOTE — Progress Notes (Signed)
  [facility] OFFICE PROGRESS NOTE   Diagnosis:  Appendiceal carcinoma.  INTERVAL HISTORY:   Laura [name] returns as scheduled. She completed cycle 4 CAPOX beginning 09/16/2013. She had nausea lasting 4-5 days. No vomiting. She did not take her home antiemetic. No mouth sores. No diarrhea. The cold sensitivity lasted about 10 days. She denies persistent neuropathy symptoms. She has noted some peeling on her hands. No hand or foot pain or redness. She notes she is becoming more tired after each cycle. She has some discomfort at the left low abdomen. She thinks this is related to constipation. She plans on beginning a laxative.  Objective:  Vital signs in last 24 hours:  Blood pressure 150/88, pulse 79, temperature 97.9 F (36.6 C), temperature source Oral, height 5\' 1"  (1.549 m), weight 184 lb 14.4 oz (83.87 kg), not currently breastfeeding.    HEENT: No thrush or ulcerations. Resp: Lungs clear. Cardio: Regular cardiac rhythm. GI: Abdomen soft and nontender. No hepatomegaly. Vascular: No leg edema. Neuro: Vibratory sense moderately decreased over the fingertips on the right hand and minimally decreased over the fingertips on the left hand.  Skin: Palms without erythema.    Lab Results:  Lab Results  Component Value Date   WBC 4.4 10/07/2013   HGB 11.9 10/07/2013   HCT 36.3 10/07/2013   MCV 96.0 10/07/2013   PLT 134* 10/07/2013   NEUTROABS 2.1 10/07/2013    Imaging:  No results found.  Medications: I have reviewed the patient's current medications.  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  CTs of the chest, abdomen, and pelvis on 06/02/2013 revealed an appendix mass and lymphadenopathy in the ileocecal mesentery.  Colonoscopy 06/12/2013 with a cecal/appendiceal orifice mass, status post a biopsy confirming poorly  differentiated adenocarcinoma with signet ring cell features.  Initiation of CAPOX 07/15/2013. 2. Cesarean section delivery of her second child 05/20/2013  3. Anemia-potentially related to pregnancy/surgical blood loss versus a malignancy. Improved.  4. Delayed nausea following cycle 1 CAPOX. Aloxi and Emend added beginning with cycle 2.  5. Elevated liver enzymes 08/27/2013. Stable elevation 09/16/2013. Improved 10/07/2013. 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    Disposition: Laura [name] appears stable. She has completed 4 cycles of CAPOX. Plan to proceed with cycle 5 today as scheduled. Further chemotherapy on hold pending her upcoming appointment with Dr. Clovis Riley at [facility] on 11/07/2013.  She will return for a followup visit here the afternoon of 11/07/2013.   Plan reviewed with Dr. Benay Spice.   Ned Card ANP/GNP-BC   10/07/2013  12:31 PM

## 2013-10-07 NOTE — Patient Instructions (Signed)
Senokot S 2 tabs twice daily as needed for constipation  OR Miralax 17 grams in 8 ounces water daily as needed for constipation

## 2013-10-07 NOTE — Patient Instructions (Signed)
[facility] Discharge Instructions for Patients Receiving Chemotherapy  Today you received the following chemotherapy agents avastin/oxaliplatin.   To help prevent nausea and vomiting after your treatment, we encourage you to take your nausea medication as directed. If you develop nausea and vomiting that is not controlled by your nausea medication, call the clinic.   BELOW ARE SYMPTOMS THAT SHOULD BE REPORTED IMMEDIATELY:  *FEVER GREATER THAN 100.5 F  *CHILLS WITH OR WITHOUT FEVER  NAUSEA AND VOMITING THAT IS NOT CONTROLLED WITH YOUR NAUSEA MEDICATION  *UNUSUAL SHORTNESS OF BREATH  *UNUSUAL BRUISING OR BLEEDING  TENDERNESS IN MOUTH AND THROAT WITH OR WITHOUT PRESENCE OF ULCERS  *URINARY PROBLEMS  *BOWEL PROBLEMS  UNUSUAL RASH Items with * indicate a potential emergency and should be followed up as soon as possible.  Feel free to call the clinic you have any questions or concerns. The clinic phone number is [phone].

## 2013-10-07 NOTE — Telephone Encounter (Signed)
gave pt appt for lab and MD for August 2015 °

## 2013-11-03 ENCOUNTER — Ambulatory Visit (HOSPITAL_BASED_OUTPATIENT_CLINIC_OR_DEPARTMENT_OTHER): Payer: BC Managed Care – PPO

## 2013-11-03 ENCOUNTER — Telehealth: Payer: Self-pay | Admitting: Oncology

## 2013-11-03 ENCOUNTER — Ambulatory Visit (HOSPITAL_BASED_OUTPATIENT_CLINIC_OR_DEPARTMENT_OTHER): Payer: BC Managed Care – PPO | Admitting: Nurse Practitioner

## 2013-11-03 ENCOUNTER — Telehealth: Payer: Self-pay | Admitting: *Deleted

## 2013-11-03 ENCOUNTER — Encounter: Payer: Self-pay | Admitting: Nurse Practitioner

## 2013-11-03 VITALS — BP 146/89 | HR 93 | Temp 98.4°F | Resp 18 | Ht 61.0 in | Wt 184.6 lb

## 2013-11-03 DIAGNOSIS — C181 Malignant neoplasm of appendix: Secondary | ICD-10-CM

## 2013-11-03 DIAGNOSIS — D696 Thrombocytopenia, unspecified: Secondary | ICD-10-CM

## 2013-11-03 DIAGNOSIS — R740 Nonspecific elevation of levels of transaminase and lactic acid dehydrogenase [LDH]: Secondary | ICD-10-CM

## 2013-11-03 DIAGNOSIS — E46 Unspecified protein-calorie malnutrition: Secondary | ICD-10-CM

## 2013-11-03 DIAGNOSIS — I10 Essential (primary) hypertension: Secondary | ICD-10-CM

## 2013-11-03 DIAGNOSIS — E8809 Other disorders of plasma-protein metabolism, not elsewhere classified: Secondary | ICD-10-CM

## 2013-11-03 DIAGNOSIS — R7401 Elevation of levels of liver transaminase levels: Secondary | ICD-10-CM | POA: Insufficient documentation

## 2013-11-03 DIAGNOSIS — R7402 Elevation of levels of lactic acid dehydrogenase (LDH): Secondary | ICD-10-CM

## 2013-11-03 LAB — COMPREHENSIVE METABOLIC PANEL (CC13)
ALT: 41 U/L (ref 0–55)
AST: 39 U/L — ABNORMAL HIGH (ref 5–34)
Albumin: 3.4 g/dL — ABNORMAL LOW (ref 3.5–5.0)
Alkaline Phosphatase: 187 U/L — ABNORMAL HIGH (ref 40–150)
Anion Gap: 7 mEq/L (ref 3–11)
BILIRUBIN TOTAL: 0.58 mg/dL (ref 0.20–1.20)
BUN: 9.6 mg/dL (ref 7.0–26.0)
CO2: 27 mEq/L (ref 22–29)
CREATININE: 0.8 mg/dL (ref 0.6–1.1)
Calcium: 9.4 mg/dL (ref 8.4–10.4)
Chloride: 107 mEq/L (ref 98–109)
GLUCOSE: 84 mg/dL (ref 70–140)
Potassium: 4.3 mEq/L (ref 3.5–5.1)
SODIUM: 140 meq/L (ref 136–145)
TOTAL PROTEIN: 6.6 g/dL (ref 6.4–8.3)

## 2013-11-03 LAB — CBC WITH DIFFERENTIAL/PLATELET
BASO%: 0.3 % (ref 0.0–2.0)
BASOS ABS: 0 10*3/uL (ref 0.0–0.1)
EOS%: 1.5 % (ref 0.0–7.0)
Eosinophils Absolute: 0.1 10*3/uL (ref 0.0–0.5)
HCT: 38.8 % (ref 34.8–46.6)
HEMOGLOBIN: 12.7 g/dL (ref 11.6–15.9)
LYMPH%: 28.4 % (ref 14.0–49.7)
MCH: 32.2 pg (ref 25.1–34.0)
MCHC: 32.7 g/dL (ref 31.5–36.0)
MCV: 98.2 fL (ref 79.5–101.0)
MONO#: 0.5 10*3/uL (ref 0.1–0.9)
MONO%: 15.5 % — AB (ref 0.0–14.0)
NEUT#: 1.9 10*3/uL (ref 1.5–6.5)
NEUT%: 54.3 % (ref 38.4–76.8)
Platelets: 72 10*3/uL — ABNORMAL LOW (ref 145–400)
RBC: 3.95 10*6/uL (ref 3.70–5.45)
RDW: 17.5 % — ABNORMAL HIGH (ref 11.2–14.5)
WBC: 3.4 10*3/uL — ABNORMAL LOW (ref 3.9–10.3)
lymph#: 1 10*3/uL (ref 0.9–3.3)
nRBC: 0 % (ref 0–0)

## 2013-11-03 MED ORDER — CLONIDINE HCL 0.1 MG PO TABS
ORAL_TABLET | ORAL | Status: AC
  Filled 2013-11-03: qty 2

## 2013-11-03 MED ORDER — HYDROCHLOROTHIAZIDE 25 MG PO TABS: 25.0000 mg | ORAL_TABLET | Freq: Every day | ORAL | Status: DC

## 2013-11-03 MED ORDER — CLONIDINE HCL 0.1 MG PO TABS
0.2000 mg | ORAL_TABLET | Freq: Once | ORAL | Status: AC
  Administered 2013-11-03: 0.2 mg via ORAL

## 2013-11-03 NOTE — Assessment & Plan Note (Signed)
Patient continues to recover from her Capeox chemotherapy.  Patient has plans to return this coming Friday, 11/07/2013 for further followup.

## 2013-11-03 NOTE — Assessment & Plan Note (Signed)
Per patient-blood pressure at home this morning was 150/101.  Initial blood pressure checked while at the cancer Center was 164/93.  Patient does appear to have been symptomatic from her hypertension; with complaints of headache and occasional blurry vision.  Continues to deny any nausea or other GI symptoms.  Patient was given clonidine 0.2 mg by mouth while at the [facility] with her blood pressure responding and down to 146/89.  Will initiate patient on HCTZ 25 mg daily.  Patient states that she does not have a primary care provider to manage her blood pressure.  Therefore, patient will have close followup here at the [facility] for the time being.  Patient has plans to return this coming Friday, 11/07/2013; and we will recheck her blood pressure at that time.

## 2013-11-03 NOTE — Assessment & Plan Note (Signed)
Albumin remains slightly low; and patient was encouraged to push protein is much as possible. 

## 2013-11-03 NOTE — Telephone Encounter (Signed)
Message from pt reporting BP 150/101 at home today. Also reporting headache. Returned call, pt reports she repeated BP: 144/106. Pt is not on any BP meds. When asked about vision changes, pt stated her "peripheral vision was not there" yesterday. No vision issues today.  Dr. Benay Spice made aware of above. Order received to work pt in to symptom management clinic. Called pt with appt. She is about an hour away. Instructed her to report to ED if symptoms worsen or loss of vision reoccurs. She voiced understanding.

## 2013-11-03 NOTE — Assessment & Plan Note (Signed)
Platelets have decreased from 134 down to 72.  This is most likely a continued chemotherapy side effect.  Patient denies any issues with either easy bleeding or bruising.  Will continue to monitor.

## 2013-11-03 NOTE — Telephone Encounter (Signed)
lab for today per 8/3 pof already scheduled and complete and f/u for 8/7 already on schedule. no  new orders

## 2013-11-03 NOTE — Progress Notes (Signed)
[place]   Chief Complaint  Patient presents with  . Headache    HPI: Laura [name] 43 y.o. 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  Patient is status post Capeox chemotherapy.  Patient called the cancer Center today requesting an urgent care visit.  She states that she has been experiencing symptomatic hypertension complaint of headache and occasional vision changes.  She denies any dizziness.  She denies any other symptoms whatsoever.  She denies any recent fevers or chills as well.   Headache  Associated symptoms include blurred vision. Pertinent negatives include no eye pain, eye redness, fever or photophobia.    CURRENT THERAPY: Upcoming Treatment Dates - COLORECTAL Xelox (Capeox) q21d Days with orders from any treatment category:  10/28/2013      SCHEDULING COMMUNICATION      palonosetron (ALOXI) injection 0.25 mg      fosaprepitant (EMEND) 150 mg in sodium chloride 0.9 % 145 mL IVPB      dexamethasone (DECADRON) injection 10 mg      oxaliplatin (ELOXATIN) 250 mg in dextrose 5 % 500 mL chemo infusion      sodium chloride 0.9 % injection 10 mL      heparin lock flush 100 unit/mL      heparin lock flush 100 unit/mL      alteplase (CATHFLO ACTIVASE) injection 2 mg      sodium chloride 0.9 % injection 3 mL      dextrose 5 % solution      TREATMENT CONDITIONS    Review of Systems  Constitutional: Negative.  Negative for fever and chills.  Eyes: Positive for blurred vision. Negative for photophobia, pain, discharge and redness.  Respiratory: Negative.   Cardiovascular: Negative.   Gastrointestinal: Negative.   Genitourinary: Negative.   Musculoskeletal: Negative.   Skin: Negative.   Neurological: Positive for headaches.  Endo/Heme/Allergies: Negative.   Psychiatric/Behavioral: Negative.   All other systems reviewed and are negative.   Past Medical History  Diagnosis Date  . Medical history non-contributory   . Postpartum care following  cesarean delivery (05/20/13) 05/20/2013  . S/P repeat low transverse C-section 05/20/2013  . Cancer 06-05-13    Dx. 1 week ago ? "appendix cancer"    Past Surgical History  Procedure Laterality Date  . Cesarean section    . Cesarean section N/A 05/20/2013    Procedure: CESAREAN SECTION;  Surgeon: [name], MD;  Location: [facility];  Service: Obstetrics;  Laterality: N/A;  . 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     has No Known Allergies.    Medication List       This list is accurate as of: 11/03/13  6:41 PM.  Always use your most recent med list.               capecitabine 500 MG tablet  Commonly known as:  XELODA  Take 3 tablets (1,500 mg total) by mouth 2 (two) times daily after a meal. For 14 days then 7 days off.  hydrochlorothiazide 25 MG tablet  Commonly known as:  HYDRODIURIL  Take 1 tablet (25 mg total) by mouth daily.  Start taking on:  11/04/2013     ibuprofen 200 MG tablet  Commonly known as:  ADVIL,MOTRIN  Take 400 mg by mouth every 6 (six) hours as needed for mild pain or moderate pain.     lidocaine-prilocaine cream  Commonly known as:  EMLA  Apply 1 application topically as needed. Apply to PAC 1-2 hours prior to stick and cover with plastic wrap     loperamide 2 MG capsule  Commonly known as:  IMODIUM  Take 1  capsule (2 mg total) by mouth as needed for diarrhea or loose stools (Take up to 8 tabs/ day for diarrhea).     phenylephrine 10 MG Tabs tablet  Commonly known as:  SUDAFED PE  Take 10 mg by mouth daily as needed.     prochlorperazine 10 MG tablet  Commonly known as:  COMPAZINE  Take 1 tablet (10 mg total) by mouth every 6 (six) hours as needed for nausea.         PHYSICAL EXAMINATION  Blood pressure 146/89, pulse 93, temperature 98.4 F (36.9 C), temperature source Oral, resp. rate 18, height 5\' 1"  (1.549 m), weight 184 lb 9.6 oz (83.734 kg), SpO2 99.00%, not currently breastfeeding.  Physical Exam  Nursing note and vitals reviewed. Constitutional: She is oriented to person, place, and time and well-developed, well-nourished, and in no distress.  HENT:  Head: Normocephalic and atraumatic.  Eyes: Conjunctivae and EOM are normal. Pupils are equal, round, and reactive to light. Right eye exhibits no discharge. Left eye exhibits no discharge.  Neck: Normal range of motion. Neck supple.  Cardiovascular: Normal rate, regular rhythm and normal heart sounds.  Exam reveals no friction rub.   No murmur heard. Pulmonary/Chest: Effort normal and breath sounds normal. No respiratory distress.  Abdominal: Soft. Bowel sounds are normal. She exhibits no mass. There is no tenderness. There is no rebound and no guarding.  Musculoskeletal: Normal range of motion. She exhibits no edema and no tenderness.  Lymphadenopathy:    She has no cervical adenopathy.  Neurological: She is alert and oriented to person, place, and time. No cranial nerve deficit. Gait normal. GCS score is 15.  Skin: Skin is warm and dry. No rash noted.  Psychiatric: Affect normal.    LABORATORY DATA:. CBC  Lab Results  Component Value Date   WBC 3.4* 11/03/2013   RBC 3.95 11/03/2013   HGB 12.7 11/03/2013   HCT 38.8 11/03/2013   PLT 72 Large platelets present* 11/03/2013   MCV 98.2 11/03/2013   MCH 32.2 11/03/2013   MCHC 32.7  11/03/2013   RDW 17.5* 11/03/2013   LYMPHSABS 1.0 11/03/2013   MONOABS 0.5 11/03/2013   EOSABS 0.1 11/03/2013   BASOSABS 0.0 11/03/2013     CMET  Lab Results  Component Value Date   NA 140 11/03/2013   K 4.3 11/03/2013   CO2 27 11/03/2013   GLUCOSE 84 11/03/2013   BUN 9.6 11/03/2013   CREATININE 0.8 11/03/2013   CALCIUM 9.4 11/03/2013   PROT 6.6 11/03/2013   ALBUMIN 3.4* 11/03/2013   AST 39* 11/03/2013   ALT 41 11/03/2013   ALKPHOS 187* 11/03/2013   BILITOT 0.58 11/03/2013   ASSESSMENT/PLAN:    Appendix carcinoma  Assessment & Plan Patient continues to recover from her Capeox chemotherapy.  Patient has plans to return this coming Friday, 11/07/2013 for further followup.  Hypertension  Assessment & Plan Per patient-blood pressure at home this morning was 150/101.  Initial blood pressure checked while at the cancer Center was 164/93.  Patient does appear to have been symptomatic from her hypertension; with complaints of headache and occasional blurry vision.  Continues to deny any nausea or other GI symptoms.  Patient was given clonidine 0.2 mg by mouth while at the [facility] with her blood pressure responding and down to 146/89.  Will initiate patient on HCTZ 25 mg daily.  Patient states that she does not have a primary care provider to manage her blood pressure.  Therefore, patient will have close followup here at the [facility] for the time being.  Patient has plans to return this coming Friday, 11/07/2013; and we will recheck her blood pressure at that time.   Transaminitis  Assessment & Plan Liver enzymes continue to trend down; with AST only slightly elevated at 39 today.   Hyperphosphatemia  Assessment & Plan Alkaline phosphatase remains stable at 187 today.  Previously alkaline phosphatase has been 157.   Hypoalbuminemia due to protein-calorie malnutrition  Assessment & Plan Albumin remains slightly low; and patient was encouraged to push protein is much as  possible.   Thrombocytopenia, unspecified  Assessment & Plan Platelets have decreased from 134 down to 72.  This is most likely a continued chemotherapy side effect.  Patient denies any issues with either easy bleeding or bruising.  Will continue to monitor.   Patient stated understanding of all instructions; and was in agreement with this plan of care. The patient knows to call the clinic with any problems, questions or concerns.   Review/collaboration with Dr. Benay Spice regarding all aspects of patient's visit today.   Total time spent with patient was 40 minutes;  with greater than 75 percent of that time spent in face to face counseling regarding her symptoms, management of her blood pressure and documentatin of BP log,  and coordination of care and follow up.  Disclaimer: This note was dictated with voice recognition software. Similar sounding words can inadvertently be transcribed and may not be corrected upon review.   Drue Second, NP 11/03/2013

## 2013-11-03 NOTE — Assessment & Plan Note (Signed)
Alkaline phosphatase remains stable at 187 today.  Previously alkaline phosphatase has been 157.

## 2013-11-03 NOTE — Assessment & Plan Note (Signed)
Liver enzymes continue to trend down; with AST only slightly elevated at 39 today.

## 2013-11-04 ENCOUNTER — Telehealth: Payer: Self-pay | Admitting: *Deleted

## 2013-11-04 NOTE — Telephone Encounter (Signed)
Called Mauriana Dann [place] at mobile [phone] number(s).  Message left requesting a return call for [facility] follow up.  Patient seen for headache associated with elevate b/p and visual disturbance.  Awaiting return call from patient.

## 2013-11-05 ENCOUNTER — Telehealth: Payer: Self-pay | Admitting: *Deleted

## 2013-11-05 NOTE — Telephone Encounter (Signed)
Call from pt requesting lab results. Results given. She reports she is taking HCTZ daily. Checking BP at home, still elevated. Instructed her to bring readings to office for 8/7 visit. She voiced understanding.

## 2013-11-07 ENCOUNTER — Encounter: Payer: Self-pay | Admitting: *Deleted

## 2013-11-07 ENCOUNTER — Other Ambulatory Visit: Payer: BC Managed Care – PPO

## 2013-11-07 ENCOUNTER — Other Ambulatory Visit: Payer: Self-pay | Admitting: Nurse Practitioner

## 2013-11-07 ENCOUNTER — Telehealth: Payer: Self-pay | Admitting: Nurse Practitioner

## 2013-11-07 ENCOUNTER — Ambulatory Visit (HOSPITAL_BASED_OUTPATIENT_CLINIC_OR_DEPARTMENT_OTHER): Payer: BC Managed Care – PPO | Admitting: Nurse Practitioner

## 2013-11-07 VITALS — BP 162/99 | HR 107 | Temp 98.3°F | Resp 18 | Ht 61.0 in | Wt 178.4 lb

## 2013-11-07 DIAGNOSIS — R109 Unspecified abdominal pain: Secondary | ICD-10-CM

## 2013-11-07 DIAGNOSIS — C181 Malignant neoplasm of appendix: Secondary | ICD-10-CM

## 2013-11-07 DIAGNOSIS — I10 Essential (primary) hypertension: Secondary | ICD-10-CM

## 2013-11-07 DIAGNOSIS — C50919 Malignant neoplasm of unspecified site of unspecified female breast: Secondary | ICD-10-CM

## 2013-11-07 DIAGNOSIS — C7989 Secondary malignant neoplasm of other specified sites: Secondary | ICD-10-CM

## 2013-11-07 DIAGNOSIS — C801 Malignant (primary) neoplasm, unspecified: Secondary | ICD-10-CM

## 2013-11-07 DIAGNOSIS — R918 Other nonspecific abnormal finding of lung field: Secondary | ICD-10-CM

## 2013-11-07 DIAGNOSIS — C779 Secondary and unspecified malignant neoplasm of lymph node, unspecified: Secondary | ICD-10-CM

## 2013-11-07 DIAGNOSIS — G620 Drug-induced polyneuropathy: Secondary | ICD-10-CM

## 2013-11-07 MED ORDER — AMLODIPINE BESYLATE 5 MG PO TABS: 5.0000 mg | ORAL_TABLET | Freq: Every day | ORAL | Status: DC

## 2013-11-07 MED ORDER — TRAMADOL HCL 50 MG PO TABS: 50.0000 mg | ORAL_TABLET | Freq: Four times a day (QID) | ORAL | Status: DC | PRN

## 2013-11-07 NOTE — Progress Notes (Signed)
[place] Work  Clinical Social Work was referred by patient navigator for assessment of psychosocial needs to due to change in diagnosis.  Clinical Social Worker met with patient and husband after appointment at [facility] to offer support and assess for needs.  Pt and husband are interested in now applying for ss disability. Pt had been trying to work during treatment and had just taken off time with no pay. She is aware of how to begin process of STD through her HR department. CSW also discussed process to apply for ss disability due to change in diagnosis. CSW briefly discussed emotional coping and resources to assist. Pt and husband are considering the Family Night in September to assist their 44 year old as well. Pt open to meeting with CSW to discuss needs further, but were ready to go today. They are aware of how to make appt and plan to begin process for ss disability.    Clinical Social Work interventions: Problem solving Emotional support Resource education  Loren Racer, [place] Clinical Social Worker [facility] for [place] Wednesday, Thursday and Friday Phone: [phone] Fax: [phone]

## 2013-11-07 NOTE — Progress Notes (Signed)
Spoke with Signa Kell in lab to request the following tests on Accession: [id number]:     KRAS, MSI,  BRAF , and Her-2 Neu

## 2013-11-07 NOTE — Progress Notes (Addendum)
Laura [name] returns as scheduled. She completed cycle 5 CAPOX/Avastin beginning 10/07/2013. She denies nausea/vomiting. No mouth sores. No diarrhea. She has intermittent numbness/tingling in the fingertips. Appetite varies. She has lost some weight. Over the past 2-3 weeks she has had intermittent low abdominal pain. No further headaches or vision change. No bleeding. She denies shortness of breath. No cough or fever.  Objective:  Vital signs in last 24 hours:  Blood pressure 162/99, pulse 107, temperature 98.3 F (36.8 C), temperature source Oral, resp. rate 18, height 5\' 1"  (1.549 m), weight 178 lb 6.4 oz (80.922 kg), SpO2 99.00%, not currently breastfeeding.    HEENT: No thrush or ulcerations. Resp: Lungs clear bilaterally. Cardio: Regular rate and rhythm. GI: No hepatomegaly. No mass. Firmness low mid abdomen. Vascular: No leg edema.   Port-A-Cath site without erythema.    Lab Results:  Lab Results  Component Value Date   WBC 3.4* 11/03/2013   HGB 12.7 11/03/2013   HCT 38.8 11/03/2013   MCV 98.2 11/03/2013   PLT 72 Large platelets present* 11/03/2013   NEUTROABS 1.9 11/03/2013    Imaging:  No results found.  Medications: I have reviewed the patient's current medications.  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  CTs of the chest, abdomen, and pelvis on 06/02/2013 revealed an appendix mass and lymphadenopathy in the ileocecal mesentery.  Colonoscopy 06/12/2013 with a cecal/appendiceal orifice mass, status post a biopsy confirming poorly differentiated adenocarcinoma with signet ring cell features.  Initiation of CAPOX 07/15/2013. Avastin added with cycle 2. Cycle 5 CAPOX/Avastin 10/07/2013. Restaging CT evaluation  (done at [facility] 11/07/2013 showed increase in the metastatic implants in the peritoneum in the abdomen and pelvis. Multiple enlarged ileocolic lymph nodes. The appendix was again seen to be enlarged filled with soft tissue and measuring up to 2.4 cm. Multiple nodules up to 5 mm throughout the right and left lung not present on the previous study. 2. Cesarean section delivery of her second child 05/20/2013  3. Anemia-potentially related to pregnancy/surgical blood loss versus a malignancy. Improved.  4. Delayed nausea following cycle 1 CAPOX. Aloxi and Emend added beginning with cycle 2.  5. Elevated liver enzymes 08/27/2013. Stable elevation 09/16/2013. Improved 10/07/2013.  6. Oxaliplatin neuropathy. Moderate decrease in vibratory sense over the fingertips on the right hand 10/07/2013. 7. Hypertension potentially related to Avastin. She is currently on hydrochlorothiazide.    Disposition: Laura [name] has completed 5 cycles of CAPOX/Avastin. The restaging CT evaluation done earlier today at [facility] shows progressive disease in the abdomen and pelvis and multiple new small nodules throughout both lungs. She was seen by Dr. [facility]. Systemic therapy is recommended. Dr. Lenis Noon discussed treatment with FOLFIRI. We reviewed potential toxicities associated with irinotecan including myelosuppression, hair loss, diarrhea (possibly severe), nausea. We also reviewed potential toxicities associated with 5-fluorouracil including mouth sores, skin hyperpigmentation, hand-foot syndrome. She is agreeable to proceed. She will return for cycle 1 on 11/20/2013. The plan is for 4-5 cycles prior to a restaging evaluation. We will request K-ras, BRAF, HER-2 and MSI testing on the tumor.  She is experiencing low abdominal pain. She was given a prescription for tramadol 50 mg every 6 hours as needed.  She was recently started on hydrochlorothiazide for hypertension. We will discontinue hydrochlorothiazide and she will begin  Norvasc 5 mg  daily. Of note, she has not taken her blood pressure medication yet today.  We will see Laura [name] in followup prior to cycle 2 FOLFIRI on 12/04/2013. She will contact the office in the interim with any problems.  Patient seen with Dr. Benay Spice. 30 minutes were spent face-to-face at today's visit with the majority of the time involved in counseling/coordination of care.  Ned Card ANP/GNP-BC   11/07/2013  5:02 PM  This was a shared visit with Ned Card.  I discussed the CT findings with Laura [name] and her husband. I discussed the case with Dr. Clovis Riley. She has evidence of progressive metastatic disease while treated with CAPOX/Avastin. We will submit her tumor for additional molecular testing to include K-ras, HER-2/neu, microsatellite instability, and BRAF testing.  I recommend FOLFIRI chemotherapy. We reviewed the potential toxicities associated with the FOLFIRI regimen. She agrees to proceed.  Julieanne Manson, M.D.

## 2013-11-07 NOTE — Patient Instructions (Signed)
Stop hydrochlorathiazide. Begin Norvasc 5 mg daily.

## 2013-11-07 NOTE — Telephone Encounter (Signed)
Pt confirmed labs/ov per 08/07 POF, gave pt AVS....KJ °

## 2013-11-07 NOTE — Progress Notes (Signed)
Met with patient during today's visit.  She received sad news from Dr. Clovis Riley at [facility] today and is here to find out next steps from Dr. Benay Spice.  She reports that she has a strong family support system.  She is not sure right now what she will do regarding her work.  This RN spoke with SW, G. Hock,  who is going to meet with patient for emotional support and other needs.  Patient understands to call for assistance as needs arise.   Spoke with Signa Kell in Pathology re: order from Dr. Benay Spice to perform KRAS and MSI testing on Accession: [id number].

## 2013-11-10 ENCOUNTER — Other Ambulatory Visit (HOSPITAL_COMMUNITY)
Admission: RE | Admit: 2013-11-10 | Discharge: 2013-11-10 | Disposition: A | Discharge status: Home / Self Care | Payer: BC Managed Care – PPO | Source: Ambulatory Visit | Attending: Oncology | Admitting: Oncology

## 2013-11-10 ENCOUNTER — Telehealth: Payer: Self-pay | Admitting: *Deleted

## 2013-11-10 DIAGNOSIS — C779 Secondary and unspecified malignant neoplasm of lymph node, unspecified: Secondary | ICD-10-CM

## 2013-11-10 DIAGNOSIS — C50919 Malignant neoplasm of unspecified site of unspecified female breast: Secondary | ICD-10-CM | POA: Insufficient documentation

## 2013-11-10 DIAGNOSIS — C181 Malignant neoplasm of appendix: Secondary | ICD-10-CM | POA: Diagnosis not present

## 2013-11-10 NOTE — Telephone Encounter (Signed)
Per staff message and POF I have scheduled appts. Advised scheduler of appts. JMW  

## 2013-11-13 ENCOUNTER — Telehealth: Payer: Self-pay | Admitting: *Deleted

## 2013-11-13 ENCOUNTER — Other Ambulatory Visit: Payer: Self-pay | Admitting: *Deleted

## 2013-11-13 ENCOUNTER — Other Ambulatory Visit: Payer: Self-pay | Admitting: Nurse Practitioner

## 2013-11-13 NOTE — Telephone Encounter (Signed)
Left VM she missed call yesterday from nurse. Laura [name] is having intermittent vaginal discharge-brownish color. Has not had a menstrual period in few months. Asking if this is normal? Denies that it feels like yeast infection. No fever or discomfort. Also asking if Dr. Benay Spice will get a copy of the CT report that was done at [facility] on Friday? [name] Confirmed that Dr. Benay Spice has access to [facility] report through [place] and will be able to see it. He will not be able to pull up scan itself, but she can obtain a CD from [facility] to bring here.

## 2013-11-16 ENCOUNTER — Other Ambulatory Visit: Payer: Self-pay | Admitting: Oncology

## 2013-11-17 ENCOUNTER — Telehealth: Payer: Self-pay | Admitting: *Deleted

## 2013-11-17 NOTE — Telephone Encounter (Signed)
Message from pt stating she is going for a second opinion. Just wanted to make MD aware. Left message on voicemail requesting return call  ? name of MD. Does she need records faxed? Will she be treated this week as scheduled?

## 2013-11-18 ENCOUNTER — Telehealth: Payer: Self-pay | Admitting: *Deleted

## 2013-11-18 NOTE — Telephone Encounter (Signed)
Message from pt stating she is seeing an MD out of state on 8/24 and needs disability forms completed. Attempted to return call, no answer. Left fax # on voicemail. Also requested pt leave detailed message to let me know whether she needs records faxed and if she plans to keep chemo appts this week.

## 2013-11-19 ENCOUNTER — Telehealth: Payer: Self-pay | Admitting: Oncology

## 2013-11-19 NOTE — Telephone Encounter (Signed)
Sent records to [place] on 11/14/13.

## 2013-11-20 ENCOUNTER — Inpatient Hospital Stay: Payer: BC Managed Care – PPO

## 2013-11-20 ENCOUNTER — Other Ambulatory Visit: Payer: BC Managed Care – PPO

## 2013-11-25 ENCOUNTER — Encounter (HOSPITAL_COMMUNITY): Payer: Self-pay

## 2013-12-01 ENCOUNTER — Telehealth: Payer: Self-pay | Admitting: Nurse Practitioner

## 2013-12-01 NOTE — Telephone Encounter (Signed)
I spoke with Mr. Hassing regarding his wife's decision on chemotherapy and if she would be keeping her upcoming appointment. He is not sure what she has decided regarding chemotherapy. He will ask her to contact our office.

## 2013-12-01 NOTE — Telephone Encounter (Signed)
Attempted to contact Laura [name] regarding her upcoming chemotherapy appointment. There was no answer.

## 2013-12-04 ENCOUNTER — Ambulatory Visit: Payer: BC Managed Care – PPO | Admitting: Nurse Practitioner

## 2013-12-04 ENCOUNTER — Other Ambulatory Visit: Payer: BC Managed Care – PPO

## 2013-12-04 ENCOUNTER — Inpatient Hospital Stay: Payer: BC Managed Care – PPO

## 2013-12-14 ENCOUNTER — Other Ambulatory Visit: Payer: Self-pay | Admitting: Oncology

## 2013-12-18 ENCOUNTER — Other Ambulatory Visit: Payer: BC Managed Care – PPO

## 2013-12-18 ENCOUNTER — Ambulatory Visit: Payer: BC Managed Care – PPO | Admitting: Nurse Practitioner

## 2014-01-16 ENCOUNTER — Other Ambulatory Visit: Payer: Self-pay

## 2014-02-02 ENCOUNTER — Encounter: Payer: Self-pay | Admitting: Nurse Practitioner

## 2014-02-27 ENCOUNTER — Other Ambulatory Visit: Payer: Self-pay | Admitting: Oncology

## 2014-03-05 ENCOUNTER — Other Ambulatory Visit: Payer: Self-pay | Admitting: Oncology

## 2014-03-05 DIAGNOSIS — I10 Essential (primary) hypertension: Secondary | ICD-10-CM

## 2014-03-05 NOTE — Telephone Encounter (Signed)
Refill to be sent to appropriate MD per Dr. Benay Spice.  Will notify pharmacy to send to PCP.

## 2014-09-07 ENCOUNTER — Telehealth: Payer: Self-pay | Admitting: *Deleted

## 2014-09-07 ENCOUNTER — Other Ambulatory Visit (HOSPITAL_COMMUNITY): Payer: Self-pay | Admitting: Nurse Practitioner

## 2014-09-07 DIAGNOSIS — C181 Malignant neoplasm of appendix: Secondary | ICD-10-CM

## 2014-09-07 DIAGNOSIS — R188 Other ascites: Secondary | ICD-10-CM

## 2014-09-07 NOTE — Telephone Encounter (Signed)
Laura Mackie, RN from Dr. Kennith Maes office [phone] called reports "pt is admitted at [facility] for discomfort (ascitics) and [facility] does not do therapeutic paracentesis; what would Dr. Benay Spice recommend?"  Pervious pt of Benay Spice; last seen in office 11/07/2013; went to [facility] in [place] for second opinion.  Per Dr. Benay Spice; notified Laura Mackie, RN Dr. Benay Spice recommends MD from [facility] to contact Hospitalists @ [facility] or [facility] to transfer pt or contact IR for outpt paracentesis to be set up.  Laura Mackie, RN verbalized understanding.

## 2014-09-08 ENCOUNTER — Ambulatory Visit (HOSPITAL_COMMUNITY)
Admission: RE | Admit: 2014-09-08 | Discharge: 2014-09-08 | Disposition: A | Discharge status: Home / Self Care | Payer: BLUE CROSS/BLUE SHIELD | Source: Ambulatory Visit | Attending: Nurse Practitioner | Admitting: Nurse Practitioner

## 2014-09-08 DIAGNOSIS — C181 Malignant neoplasm of appendix: Secondary | ICD-10-CM | POA: Diagnosis not present

## 2014-09-08 DIAGNOSIS — R188 Other ascites: Secondary | ICD-10-CM | POA: Insufficient documentation

## 2014-09-08 MED ORDER — LIDOCAINE HCL (PF) 1 % IJ SOLN
INTRAMUSCULAR | Status: AC
  Filled 2014-09-08: qty 10

## 2014-09-08 NOTE — Procedures (Signed)
Interventional Radiology Procedure Note  Procedure: US guided paracentesis.  Fluid sent to lab.  Complications: None Recommendations:  - DC today - Routine care  - lab studies ordered by ordering physician, thank you  Signed,  Dulcy Fanny. Earleen Newport, DO

## 2014-09-11 ENCOUNTER — Other Ambulatory Visit: Payer: Self-pay | Admitting: *Deleted

## 2014-09-11 ENCOUNTER — Telehealth: Payer: Self-pay | Admitting: Nurse Practitioner

## 2014-09-11 NOTE — Telephone Encounter (Signed)
Confirmed appointment for 06/14 °

## 2014-09-11 NOTE — Telephone Encounter (Signed)
Dr. Kennith Maes office called again to see what the process is for pt to be seen to help with dx; symptom management; possible hospice, etc.  Pt did have therapeutic paracentesis on 09/08/14 @ [facility].  Per Ned Card, NP POF sent to schedulers for appt 6/14 @ 2:45.

## 2014-09-12 LAB — BODY FLUID CULTURE
CULTURE: NO GROWTH
Gram Stain: NONE SEEN

## 2014-09-14 ENCOUNTER — Telehealth: Payer: Self-pay | Admitting: *Deleted

## 2014-09-14 NOTE — Telephone Encounter (Signed)
Left message on voicemail requesting pt call office with contact # of oncologist she's been seeing. Will need medical records for office visit on 6/14. Faxed request to [place] requesting medical records.

## 2014-09-15 ENCOUNTER — Telehealth: Payer: Self-pay | Admitting: Nurse Practitioner

## 2014-09-15 ENCOUNTER — Ambulatory Visit (HOSPITAL_BASED_OUTPATIENT_CLINIC_OR_DEPARTMENT_OTHER): Payer: BLUE CROSS/BLUE SHIELD | Admitting: Nurse Practitioner

## 2014-09-15 VITALS — BP 121/83 | HR 134 | Temp 98.2°F | Resp 19 | Ht 61.0 in | Wt 130.0 lb

## 2014-09-15 DIAGNOSIS — C181 Malignant neoplasm of appendix: Secondary | ICD-10-CM

## 2014-09-15 DIAGNOSIS — R634 Abnormal weight loss: Secondary | ICD-10-CM

## 2014-09-15 MED ORDER — MORPHINE SULFATE ER 15 MG PO TBCR: 15.0000 mg | EXTENDED_RELEASE_TABLET | Freq: Two times a day (BID) | ORAL | Status: DC

## 2014-09-15 MED ORDER — PREDNISONE 20 MG PO TABS: 20.0000 mg | ORAL_TABLET | Freq: Every day | ORAL | Status: AC

## 2014-09-15 MED ORDER — PREDNISONE 20 MG PO TABS: 20.0000 mg | ORAL_TABLET | Freq: Every day | ORAL | Status: DC

## 2014-09-15 NOTE — Telephone Encounter (Signed)
S/w pt confirming MD visit with NP/LT per 06/14 POF.... Laura [name]

## 2014-09-15 NOTE — Telephone Encounter (Signed)
Received fax from [place]. There were no chemo flow sheets or mention of treatment or plan. Called spoke with Anderson Malta in medical records, she will send oncologist's note and chemo flow sheets for all treatments there.

## 2014-09-15 NOTE — [facility] Oncology Navigator Note (Signed)
Oncology Nurse Navigator Documentation  Oncology Nurse Navigator Flowsheets 09/15/2014  Navigator Encounter Type Initial MedOnc  Patient Visit Type Medonc  Treatment Phase Treatment planning  Barriers/Navigation Needs Family concerns--nutrition, weakness and pain  Time Spent with Patient 15   Met with patient and husband during patient visit. Explained the role of the GI Nurse Navigator and provided contact information. Made her aware that nutrition referral will be made for her. Encouraged her to call office on Friday with an update on her pain control with increase in MS Contin dose.  Merceda Elks, RN, BSN GI Oncology [place]

## 2014-09-15 NOTE — Progress Notes (Addendum)
[facility] OFFICE PROGRESS NOTE   Diagnosis:  Appendiceal carcinoma   INTERVAL HISTORY:  Laura [name] was last seen in our office on 11/07/2013. 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 She completed 5 cycles of CAPOX/4 cycles of Avastin 07/15/2013 through 10/07/2013. Restaging CT evaluation 11/07/2013 showed progressive disease in the abdomen and pelvis and multiple new small nodules throughout both lungs. Dr. Benay Spice recommended treatment with FOLFIRI. She did not return for follow-up.  She reports receiving further care at the [facility] in [place]. We have requested records but as of today's visit have not received her complete chart. Per Laura [name]'s report she received FOLFIRI/Cyramza for 6 cycles. She then reports receiving "targeted therapy" for one cycle. This was compoicated by thrombocytopenia. She reports undergoing "spleen embolization" in an attempt to increase the platelet count. She received another form of systemic therapy but is unable to recall the name. She reports Regorafenib was most recently recommended. She has not yet started this. She reports last having a CT scan at [facility] 5-6 weeks ago.  She is no longer able to travel to [place] due to her overall condition.  She reports undergoing a paracentesis in [place] on 08/19/2014 with 5 L of fluid removed. She is unsure if the fluid was analyzed for malignant cells. She underwent a paracentesis in [place] on 09/08/2014 with 6 L of fluid removed. There is no cytology report. She notes overall improvement in abdominal discomfort following the most recent paracentesis. She continues to have low abdominal pain "where the tumors are". She is taking Dilaudid 4 mg every 4 hours with incomplete relief. She is interested in beginning a long-acting pain medication. She is having intermittent nausea/vomiting. Bowels not moving regularly. Appetite is poor.  She is losing weight. She has tried Marinol and Megace in the past with no benefit. She denies shortness of breath. No cough or fever.  Objective:  Vital signs in last 24 hours:  Blood pressure 121/83, pulse 134, temperature 98.2 F (36.8 C), temperature source Oral, resp. rate 19, height 5\' 1"  (1.549 m), weight 130 lb (58.968 kg), SpO2 100 %, not currently breastfeeding. repeat heart rate 120    HEENT: Mouth is dry appearing. White coating over tongue. Lymphatics: No palpable cervical, supraclavicular, axillary or inguinal lymph nodes. Resp: Lungs clear bilaterally. Breath sounds diminished at the bases. Cardio: Regular, tachycardic. GI: Large palpable mass lower abdomen. Masslike fullness upper abdomen. Mild to moderate abdominal distention. Vascular: No leg edema.  Port-A-Cath without erythema.    Lab Results:  Lab Results  Component Value Date   WBC 3.4* 11/03/2013   HGB 12.7 11/03/2013   HCT 38.8 11/03/2013   MCV 98.2 11/03/2013   PLT 72 Large platelets present* 11/03/2013   NEUTROABS 1.9 11/03/2013    Imaging:  No results found.  Medications: I have reviewed the patient's current medications.  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   CTs of the chest, abdomen, and pelvis on 06/02/2013 revealed an appendix mass and lymphadenopathy in the ileocecal mesentery.   Colonoscopy 06/12/2013 with a cecal/appendiceal orifice mass, status post a biopsy confirming poorly differentiated adenocarcinoma with signet ring cell features.   Initiation of CAPOX 07/15/2013.  Avastin added with cycle 2.  Cycle 5 CAPOX/Avastin 10/07/2013.  Restaging CT evaluation (done at [facility] 11/07/2013 showed increase in the metastatic implants in the peritoneum  in the abdomen and pelvis. Multiple enlarged ileocolic lymph nodes. The  appendix was again seen to be enlarged filled with soft tissue and measuring up to 2.4 cm. Multiple nodules up to 5 mm throughout the right and left lung not present on the previous study.  Oncology care subsequently transferred to [facility] in [place] 2. Cesarean section delivery of her second child 05/20/2013  3. Anemia-potentially related to pregnancy/surgical blood loss versus a malignancy.  4. Delayed nausea following cycle 1 CAPOX. Aloxi and Emend added beginning with cycle 2.  5. Elevated liver enzymes 08/27/2013. Stable elevation 09/16/2013. Improved 10/07/2013.  6. Oxaliplatin neuropathy. Moderate decrease in vibratory sense over the fingertips on the right hand 10/07/2013. 7. Hypertension potentially related to Avastin.   Disposition: Laura [name] has metastatic appendiceal cancer. She received CAPOX/Avastin through our office with disease progression. She subsequently transferred her care to the [facility] in [place]. We do not have complete records though from her report she was treated with FOLFIRI/Cyramza and potentially two other systemic therapies with disease progression. It appears the most recent treatment recommendation was for Regorafenib which she has not yet started. We are awaiting complete records at this time.  Her performance status is poor. She has lost a significant amount of weight. We recommended that she not begin Regorafenib. For the anorexia and nausea she will begin prednisone 20 mg daily. For pain control she will begin MS Contin 15 mg every 12 hours and will continue Dilaudid as needed. She will contact the office later this week with an update on her pain control.  Dr. Benay Spice discussed treatment with [facility] versus a supportive care approach with a hospice referral. At this time she is not interested in hospice. She will return for a follow-up visit in one week for additional discussion. Hopefully we will have  her records to review in the near future.  Patient seen with Dr. Benay Spice. 35 minutes were spent face-to-face at today's visit with the majority of that time involved in counseling/coordination of care.   Ned Card ANP/GNP-BC   09/15/2014  4:28 PM  This was a shared visit with Ned Card. Laura [name] was interviewed and examined. Her performance status is poor. There is clinical evidence of progressive appendiceal carcinoma. She is not a candidate for systemic therapy in her current condition. I recommended Hospice care. Laura [name] and her husband are reluctant to enroll in Hospice at present.  We are working on obtaining records from [place]. We added a long-acting narcotic. She will try prednisone as an appetite stimulant and for nausea. Laura [name] will return for an office visit next week.  Julieanne Manson, M.D.

## 2014-09-17 ENCOUNTER — Telehealth: Payer: Self-pay | Admitting: *Deleted

## 2014-09-17 NOTE — Telephone Encounter (Signed)
Patient called this am c/o unable to sleep last night which she attributes to the prednisone she started yesterday.  Also she c/o being nauseated.  She has vomited 2-3x in the last 24 hrs.  She states her nausea is worse this am.  She took one dose of the prednisone yesterday am at 8:30am with food.  She has not taken any zofran this am.  Asked patient if she is sleeping during the day and she says some.  Talked with her about possibly getting too much sleep during the day and this is contributing (along with the prednisone) to her inability to sleep at night.  Patient states her husband thinks she has day and night reversed.  Patient saw Ned Card NP and Dr. Benay Spice 09/15/14. Instructed patient to try and stay awake today, go ahead and take her prednisone now (with food) along with her zofran.  Take her 4mg  zofran every fours hours today as instructed and then tonight use a phenergan for nausea before bed.  The phenergan causes drowsiness in some people and this may help her with sleep tonight.  She will try this regiment today and then she is due to talk with Merceda Elks RN nurse navigator tomorrow and will discuss further with her.  Patient also has a 76month old child who is in daycare during the day.  Patient does have someone there at night who can get up with the child if needed.  Patient call back is [phone].

## 2014-09-21 ENCOUNTER — Telehealth: Payer: Self-pay | Admitting: *Deleted

## 2014-09-21 NOTE — Telephone Encounter (Signed)
Oncology Nurse Navigator Documentation  Oncology Nurse Navigator Flowsheets 09/21/2014  Navigator Encounter Type Telephone-1 week F/U  Patient Visit Type -  Treatment Phase Tx planning  Barriers/Navigation Needs No barriers at this time  Time Spent with Patient 10  Called to follow up on her pain control. She reports she sees no real difference with MS Contin addition. Nausea is much better and she still has trouble sleeping. Agrees to keep her appointment tomorrow and will Discuss issues at that time.

## 2014-09-22 ENCOUNTER — Ambulatory Visit (HOSPITAL_COMMUNITY)
Admission: RE | Admit: 2014-09-22 | Discharge: 2014-09-22 | Disposition: A | Discharge status: Home / Self Care | Payer: BLUE CROSS/BLUE SHIELD | Source: Ambulatory Visit | Attending: Nurse Practitioner | Admitting: Nurse Practitioner

## 2014-09-22 ENCOUNTER — Ambulatory Visit (HOSPITAL_BASED_OUTPATIENT_CLINIC_OR_DEPARTMENT_OTHER): Payer: BLUE CROSS/BLUE SHIELD | Admitting: Nurse Practitioner

## 2014-09-22 ENCOUNTER — Ambulatory Visit: Payer: BLUE CROSS/BLUE SHIELD | Admitting: Nutrition

## 2014-09-22 ENCOUNTER — Telehealth: Payer: Self-pay | Admitting: *Deleted

## 2014-09-22 ENCOUNTER — Ambulatory Visit (HOSPITAL_COMMUNITY): Payer: BLUE CROSS/BLUE SHIELD

## 2014-09-22 ENCOUNTER — Emergency Department (HOSPITAL_COMMUNITY): Payer: BLUE CROSS/BLUE SHIELD

## 2014-09-22 ENCOUNTER — Encounter (HOSPITAL_COMMUNITY): Payer: Self-pay

## 2014-09-22 ENCOUNTER — Emergency Department (HOSPITAL_COMMUNITY)
Admission: EM | Admit: 2014-09-22 | Discharge: 2014-09-22 | Disposition: A | Discharge status: Home / Self Care | Payer: BLUE CROSS/BLUE SHIELD | Attending: Emergency Medicine | Admitting: Emergency Medicine

## 2014-09-22 VITALS — BP 132/92 | HR 139 | Temp 98.4°F | Resp 18 | Ht 61.0 in | Wt 127.7 lb

## 2014-09-22 DIAGNOSIS — Z7952 Long term (current) use of systemic steroids: Secondary | ICD-10-CM | POA: Insufficient documentation

## 2014-09-22 DIAGNOSIS — Z79899 Other long term (current) drug therapy: Secondary | ICD-10-CM | POA: Diagnosis not present

## 2014-09-22 DIAGNOSIS — C785 Secondary malignant neoplasm of large intestine and rectum: Secondary | ICD-10-CM | POA: Diagnosis not present

## 2014-09-22 DIAGNOSIS — R109 Unspecified abdominal pain: Secondary | ICD-10-CM

## 2014-09-22 DIAGNOSIS — Z9889 Other specified postprocedural states: Secondary | ICD-10-CM | POA: Diagnosis not present

## 2014-09-22 DIAGNOSIS — G893 Neoplasm related pain (acute) (chronic): Secondary | ICD-10-CM | POA: Diagnosis not present

## 2014-09-22 DIAGNOSIS — R1030 Lower abdominal pain, unspecified: Secondary | ICD-10-CM | POA: Diagnosis present

## 2014-09-22 DIAGNOSIS — R188 Other ascites: Secondary | ICD-10-CM | POA: Insufficient documentation

## 2014-09-22 DIAGNOSIS — C181 Malignant neoplasm of appendix: Secondary | ICD-10-CM

## 2014-09-22 DIAGNOSIS — K5900 Constipation, unspecified: Secondary | ICD-10-CM

## 2014-09-22 DIAGNOSIS — C7989 Secondary malignant neoplasm of other specified sites: Secondary | ICD-10-CM

## 2014-09-22 DIAGNOSIS — C799 Secondary malignant neoplasm of unspecified site: Secondary | ICD-10-CM

## 2014-09-22 DIAGNOSIS — C801 Malignant (primary) neoplasm, unspecified: Secondary | ICD-10-CM

## 2014-09-22 LAB — CBC WITH DIFFERENTIAL/PLATELET
Basophils Absolute: 0 10*3/uL (ref 0.0–0.1)
Basophils Relative: 0 % (ref 0–1)
Eosinophils Absolute: 0.1 10*3/uL (ref 0.0–0.7)
Eosinophils Relative: 1 % (ref 0–5)
HCT: 41 % (ref 36.0–46.0)
Hemoglobin: 12.6 g/dL (ref 12.0–15.0)
LYMPHS PCT: 8 % — AB (ref 12–46)
Lymphs Abs: 0.8 10*3/uL (ref 0.7–4.0)
MCH: 26.4 pg (ref 26.0–34.0)
MCHC: 30.7 g/dL (ref 30.0–36.0)
MCV: 85.8 fL (ref 78.0–100.0)
MONO ABS: 1.1 10*3/uL — AB (ref 0.1–1.0)
MONOS PCT: 11 % (ref 3–12)
Neutro Abs: 8.1 10*3/uL — ABNORMAL HIGH (ref 1.7–7.7)
Neutrophils Relative %: 80 % — ABNORMAL HIGH (ref 43–77)
Platelets: 267 10*3/uL (ref 150–400)
RBC: 4.78 MIL/uL (ref 3.87–5.11)
RDW: 16.4 % — ABNORMAL HIGH (ref 11.5–15.5)
WBC: 10.1 10*3/uL (ref 4.0–10.5)

## 2014-09-22 LAB — COMPREHENSIVE METABOLIC PANEL
ALK PHOS: 340 U/L — AB (ref 38–126)
ALT: 73 U/L — ABNORMAL HIGH (ref 14–54)
ANION GAP: 12 (ref 5–15)
AST: 77 U/L — ABNORMAL HIGH (ref 15–41)
Albumin: 3 g/dL — ABNORMAL LOW (ref 3.5–5.0)
BUN: 14 mg/dL (ref 6–20)
CHLORIDE: 99 mmol/L — AB (ref 101–111)
CO2: 29 mmol/L (ref 22–32)
Calcium: 9 mg/dL (ref 8.9–10.3)
Creatinine, Ser: 0.59 mg/dL (ref 0.44–1.00)
GFR calc non Af Amer: >60 mL/min (ref >60)
Glucose, Bld: 88 mg/dL (ref 65–99)
POTASSIUM: 3.6 mmol/L (ref 3.5–5.1)
Sodium: 140 mmol/L (ref 135–145)
TOTAL PROTEIN: 5.8 g/dL — AB (ref 6.5–8.1)
Total Bilirubin: 1.3 mg/dL — ABNORMAL HIGH (ref 0.3–1.2)

## 2014-09-22 MED ORDER — HYDROMORPHONE HCL 1 MG/ML IJ SOLN
1.0000 mg | Freq: Once | INTRAMUSCULAR | Status: AC
  Administered 2014-09-22: 1 mg via INTRAVENOUS
  Filled 2014-09-22: qty 1

## 2014-09-22 MED ORDER — MORPHINE SULFATE ER 30 MG PO TBCR: 30.0000 mg | EXTENDED_RELEASE_TABLET | Freq: Two times a day (BID) | ORAL | Status: DC

## 2014-09-22 MED ORDER — ONDANSETRON HCL 4 MG/2ML IJ SOLN
4.0000 mg | Freq: Once | INTRAMUSCULAR | Status: AC
  Administered 2014-09-22: 4 mg via INTRAVENOUS
  Filled 2014-09-22: qty 2

## 2014-09-22 MED ORDER — LIDOCAINE HCL (PF) 1 % IJ SOLN
INTRAMUSCULAR | Status: AC
  Filled 2014-09-22: qty 10

## 2014-09-22 MED ORDER — HYDROMORPHONE HCL 1 MG/ML IJ SOLN
1.0000 mg | Freq: Once | INTRAMUSCULAR | Status: DC
Start: 2014-09-22 — End: 2014-09-22

## 2014-09-22 MED ORDER — HYDROMORPHONE HCL 2 MG/ML IJ SOLN
2.0000 mg | Freq: Once | INTRAMUSCULAR | Status: AC
  Administered 2014-09-22: 2 mg via INTRAVENOUS
  Filled 2014-09-22: qty 1

## 2014-09-22 MED ORDER — HYDROMORPHONE HCL 4 MG PO TABS: 4.0000 mg | ORAL_TABLET | ORAL | Status: DC | PRN

## 2014-09-22 MED ORDER — HEPARIN SOD (PORK) LOCK FLUSH 100 UNIT/ML IV SOLN
500.0000 [IU] | Freq: Once | INTRAVENOUS | Status: AC
  Administered 2014-09-22: 500 [IU]
  Filled 2014-09-22: qty 5

## 2014-09-22 MED ORDER — IOHEXOL 300 MG/ML  SOLN
100.0000 mL | Freq: Once | INTRAMUSCULAR | Status: AC | PRN
  Administered 2014-09-22: 80 mL via INTRAVENOUS

## 2014-09-22 MED ORDER — IOHEXOL 300 MG/ML  SOLN
50.0000 mL | Freq: Once | INTRAMUSCULAR | Status: AC | PRN
  Administered 2014-09-22: 50 mL via ORAL

## 2014-09-22 NOTE — ED Provider Notes (Signed)
CSN: [id number]     Arrival date & time 09/22/14  0422 History   First MD Initiated Contact with Patient 09/22/14 0448     Chief Complaint  Patient presents with  . Abdominal Pain     (Consider location/radiation/quality/duration/timing/severity/associated sxs/prior Treatment) HPI  This is a 44 year old female with metastatic appendiceal cancer. She is on chronic morphine and Dilaudid for cancer pain. She is here with an acute exacerbation of her pain beginning about 11 PM yesterday evening. The pain is located in the lower abdomen and is associated with large tumor mass. The pain was not relieved with her usual Dilaudid and she rates as a 9 out of 10. There is been associated nausea and retching but no vomiting. She has not had a bowel movement in about 3 weeks which she attributes to decreased oral intake; she has taken MiraLAX without relief. Her fluid intake has been decreased recently as well. She develops ascites associated with her cancer and her last paracentesis was on the sixth of this month.  Past Medical History  Diagnosis Date  . Medical history non-contributory   . Postpartum care following cesarean delivery (05/20/13) 05/20/2013  . S/P repeat low transverse C-section 05/20/2013  . Cancer 06-05-13    Dx. 1 week ago ? "appendix cancer"   Past Surgical History  Procedure Laterality Date  . Cesarean section    . Cesarean section N/A 05/20/2013    Procedure: CESAREAN SECTION;  Surgeon: [name], MD;  Location: [facility];  Service: Obstetrics;  Laterality: N/A;  . Colonoscopy with propofol N/A 06/12/2013    Procedure: COLONOSCOPY WITH PROPOFOL;  Surgeon: Milus Banister, MD;  Location: [facility];  Service: Endoscopy;  Laterality: N/A;   History reviewed. No pertinent family history. History  Substance Use Topics  . Smoking status: Never Smoker   . Smokeless tobacco: Never Used  . Alcohol Use: No   OB History    Gravida Para Term Preterm AB TAB SAB Ectopic Multiple  Living   2 2 1 1      2      Review of Systems  All other systems reviewed and are negative.   Allergies  Review of patient's allergies indicates no known allergies.  Home Medications   Prior to Admission medications   Medication Sig Start Date End Date Taking? Authorizing Provider  amLODipine (NORVASC) 5 MG tablet Take 1 tablet (5 mg total) by mouth daily. Patient taking differently: Take 10 mg by mouth daily.  11/07/13  Yes Owens Shark, NP  ondansetron (ZOFRAN) 4 MG tablet Take 4 mg by mouth every 8 (eight) hours as needed for nausea or vomiting.   Yes Historical Provider, MD  OVER THE COUNTER MEDICATION Take 2 tablets by mouth at bedtime. Otc. Calms forte   Yes Historical Provider, MD  predniSONE (DELTASONE) 20 MG tablet Take 1 tablet (20 mg total) by mouth daily with breakfast. 09/15/14  Yes Owens Shark, NP  promethazine (PHENERGAN) 12.5 MG tablet Take 12.5 mg by mouth every 6 (six) hours as needed for nausea or vomiting.   Yes Historical Provider, MD  Doxylamine Succinate, Sleep, (SLEEP AID PO) Take by mouth.    Historical Provider, MD  HYDROmorphone (DILAUDID) 4 MG tablet Take 1-2 tablets (4-8 mg total) by mouth every 3 (three) hours as needed for severe pain. 09/22/14   Owens Shark, NP  morphine (MS CONTIN) 30 MG 12 hr tablet Take 1 tablet (30 mg total) by mouth every 12 (twelve) hours.  09/22/14   Owens Shark, NP  ondansetron (ZOFRAN-ODT) 4 MG disintegrating tablet Take 4 mg by mouth. 08/16/14   Historical Provider, MD   BP 137/90 mmHg  Pulse 124  Temp(Src) 97.9 F (36.6 C) (Oral)  Resp 17  SpO2 99%   Physical Exam  General: Well-developed, cachectic female in no acute distress; appearance consistent with age of record HENT: normocephalic; atraumatic Eyes: pupils equal, round and reactive to light; extraocular muscles intact Neck: supple Heart: regular rate and rhythm; tachycardia Lungs: clear to auscultation bilaterally Abdomen: soft; nondistended; mild lower  abdominal tenderness; large suprapubic mass; bowel sounds hypoactive Extremities: No deformity; full range of motion; pulses normal Neurologic: Awake, alert and oriented; motor function intact in all extremities and symmetric; no facial droop Skin: Warm and dry Psychiatric: Flat affect    ED Course  Procedures (including critical care time)   MDM  Nursing notes and vitals signs, including pulse oximetry, reviewed.  Summary of this visit's results, reviewed by myself:  Labs:  Results for orders placed or performed during the hospital encounter of 09/22/14 (from the past 24 hour(s))  CBC with Differential/Platelet     Status: Abnormal   Collection Time: 09/22/14  5:43 AM  Result Value Ref Range   WBC 10.1 4.0 - 10.5 K/uL   RBC 4.78 3.87 - 5.11 MIL/uL   Hemoglobin 12.6 12.0 - 15.0 g/dL   HCT 41.0 36.0 - 46.0 %   MCV 85.8 78.0 - 100.0 fL   MCH 26.4 26.0 - 34.0 pg   MCHC 30.7 30.0 - 36.0 g/dL   RDW 16.4 (H) 11.5 - 15.5 %   Platelets 267 150 - 400 K/uL   Neutrophils Relative % 80 (H) 43 - 77 %   Neutro Abs 8.1 (H) 1.7 - 7.7 K/uL   Lymphocytes Relative 8 (L) 12 - 46 %   Lymphs Abs 0.8 0.7 - 4.0 K/uL   Monocytes Relative 11 3 - 12 %   Monocytes Absolute 1.1 (H) 0.1 - 1.0 K/uL   Eosinophils Relative 1 0 - 5 %   Eosinophils Absolute 0.1 0.0 - 0.7 K/uL   Basophils Relative 0 0 - 1 %   Basophils Absolute 0.0 0.0 - 0.1 K/uL  Comprehensive metabolic panel     Status: Abnormal   Collection Time: 09/22/14  5:43 AM  Result Value Ref Range   Sodium 140 135 - 145 mmol/L   Potassium 3.6 3.5 - 5.1 mmol/L   Chloride 99 (L) 101 - 111 mmol/L   CO2 29 22 - 32 mmol/L   Glucose, Bld 88 65 - 99 mg/dL   BUN 14 6 - 20 mg/dL   Creatinine, Ser 0.59 0.44 - 1.00 mg/dL   Calcium 9.0 8.9 - 10.3 mg/dL   Total Protein 5.8 (L) 6.5 - 8.1 g/dL   Albumin 3.0 (L) 3.5 - 5.0 g/dL   AST 77 (H) 15 - 41 U/L   ALT 73 (H) 14 - 54 U/L   Alkaline Phosphatase 340 (H) 38 - 126 U/L   Total Bilirubin 1.3 (H) 0.3 -  1.2 mg/dL   GFR calc non Af Amer >60 >60 mL/min   GFR calc Af Amer >60 >60 mL/min   Anion gap 12 5 - 15    Imaging Studies: Ct Abdomen Pelvis W Contrast  09/22/2014   CLINICAL DATA:  Severe abdominal pain and nausea since 20/3 higher hours on 09/21/2014, no relief with medications at home, no bowel movement for 3 weeks, history of carcinoma  the appendix March 2015 post chemotherapy, prior paracentesis  EXAM: CT ABDOMEN AND PELVIS WITH CONTRAST  TECHNIQUE: Multidetector CT imaging of the abdomen and pelvis was performed using the standard protocol following bolus administration of intravenous contrast. Sagittal and coronal MPR images reconstructed from axial data set.  CONTRAST:  67mL OMNIPAQUE IOHEXOL 300 MG/ML SOLN IV. Dilute oral contrast.  COMPARISON:  06/06/2013, 08/16/2014  FINDINGS: Small LEFT pleural effusion.  Numerous pulmonary nodules at the lung bases compatible with multiple pulmonary metastases up to 9 mm diameter image 2.  Tip of Port-A-Cath in RIGHT atrium.  7 mm low-attenuation focus lateral segment LEFT lobe liver image 12.  Remainder of liver, pancreas, kidneys, and adrenal glands normal.  Large area of low attenuation at anterior/inferior aspect of spleen favor splenic infarct over metaphysis unchanged from 08/16/2014.  Significant ascites with multiple soft tissue masses primarily in the mid abdomen and pelvis compatible with peritoneal carcinomatosis.  Conglomerate tumor mass in upper central pelvis measures 16.8 x 8.4 x 12.4 cm.  Soft tissue mass invades the rectus muscles of the anterior pelvis bilaterally greater on LEFT, LEFT rectus mass measuring 7.5 x 3.6 x 5.7 cm.  Pelvic tumor masses contiguous with superior aspect of urinary bladder cannot exclude bladder wall invasion.  Uterus and ovaries obscured by large pelvic mass.  Extensive mesenteric adenopathy and omental caking.  Stomach and bowel loops grossly unremarkable without evidence of obstruction.  No free intraperitoneal air,  hernia, or acute osseous findings.  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  Associated omental caking, mesenteric adenopathy, and anterior abdominal wall tumor extension.  Multiple pulmonary metastases and new small LEFT pleural effusion.  Question single metastasis at lateral segment LEFT lobe liver, new.  Large area of low attenuation within the anterior inferior spleen favoring splenic infarct or sequela of prior intervention over metastasis.  Cannot exclude bladder wall invasion by tumor at the superior margin of the urinary bladder.  Findings called to Dr. Alvino Chapel on 09/22/2014 at 0920 hours.   Electronically Signed   By: Lavonia Dana M.D.   On: 09/22/2014 09:23   US Paracentesis  09/22/2014   INDICATION: ascites  EXAM: ULTRASOUND-GUIDED PARACENTESIS  COMPARISON:  Previous para  MEDICATIONS: 10 cc 1% lidocaine.  COMPLICATIONS: None immediate  TECHNIQUE: Informed written consent was obtained from the patient after a discussion of the risks, benefits and alternatives to treatment. A timeout was performed prior to the initiation of the procedure.  Initial ultrasound scanning demonstrates a large amount of ascites within the right lower abdominal quadrant. The right lower abdomen was prepped and draped in the usual sterile fashion. 1% lidocaine with epinephrine was used for local anesthesia. Under direct ultrasound guidance, a 19 gauge, 7-cm, Yueh catheter was introduced. An ultrasound image was saved for documentation purposed. The paracentesis was performed. The catheter was removed and a dressing was applied. The patient tolerated the procedure well without immediate post procedural complication.  FINDINGS: A total of approximately 1.9 liters of yellow fluid was removed.  IMPRESSION: Successful ultrasound-guided paracentesis yielding 1.9 liters of peritoneal fluid.  Read by:  Lavonia Drafts [facility]    Electronically Signed   By: Jerilynn Mages.  Shick M.D.   On: 09/22/2014 16:32      Shanon Rosser, MD 09/22/14 2243

## 2014-09-22 NOTE — Progress Notes (Addendum)
Laura [name]   Diagnosis:  Appendiceal carcinoma  INTERVAL HISTORY:   Laura [name] returns for follow-up. She presented to the emergency department early this morning with poorly controlled abdominal pain. The pain improved with IV Dilaudid. She feels the long-acting pain medication and Dilaudid need to be increased. Last bowel movement was a month ago. She is "passing gas". She is intermittently nauseated. She noted minimal improvement in her appetite and energy level with prednisone. At times she feels "jittery and nervous".  Objective:  Vital signs in last 24 hours:  Blood pressure 132/92, pulse 139, temperature 98.4 F (36.9 C), temperature source Oral, resp. rate 18, height 5\' 1"  (1.549 m), weight 127 lb 11.2 oz (57.924 kg), SpO2 96 %, not currently breastfeeding.    HEENT: White coating over tongue. Resp: Lungs are clear. Breath sounds diminished at the left lung base. Cardio: Heart is regular, tachycardic. GI: Abdomen is distended. She appears to have ascites. Persistent large palpable mass lower abdomen. Vascular: Trace edema lower legs bilaterally. Port-A-Cath without erythema.    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 Sagittal and coronal MPR images reconstructed from axial data set.  CONTRAST:  23mL  OMNIPAQUE IOHEXOL 300 MG/ML SOLN IV. Dilute oral contrast.  COMPARISON:  06/06/2013, 08/16/2014  FINDINGS: Small LEFT pleural effusion.  Numerous pulmonary nodules at the lung bases compatible with multiple pulmonary metastases up to 9 mm diameter image 2.  Tip of Port-A-Cath in RIGHT atrium.  7 mm low-attenuation focus lateral segment LEFT lobe liver image 12.  Remainder of liver, pancreas, kidneys, and adrenal glands normal.  Large area of low attenuation at anterior/inferior aspect of spleen favor splenic infarct over metaphysis unchanged from 08/16/2014.  Significant ascites with multiple soft tissue masses primarily in the mid abdomen and pelvis compatible with peritoneal carcinomatosis.  Conglomerate tumor mass in upper central pelvis measures 16.8 x 8.4 x 12.4 cm.  Soft tissue mass invades the rectus muscles of the anterior pelvis bilaterally greater on LEFT, LEFT rectus mass measuring 7.5 x 3.6 x 5.7 cm.  Pelvic tumor masses contiguous with superior aspect of urinary bladder cannot exclude bladder wall invasion.  Uterus and ovaries obscured by large pelvic mass.  Extensive mesenteric adenopathy and omental caking.  Stomach and bowel loops grossly unremarkable without evidence of obstruction.  No free intraperitoneal air, hernia, or acute osseous findings.  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  Associated omental caking, mesenteric adenopathy, and anterior abdominal wall tumor extension.  Multiple pulmonary metastases and new small LEFT pleural effusion.  Question single metastasis at lateral segment LEFT lobe liver, new.  Large area of low attenuation within the anterior inferior spleen favoring splenic infarct or sequela of prior intervention over metastasis.  Cannot exclude bladder  wall invasion by tumor at the superior margin of the urinary bladder.  Findings called to Dr. Alvino Chapel on 09/22/2014  at 0920 hours.   Electronically Signed   By: Lavonia Dana M.D.   On: 09/22/2014 09:23    Medications: I have reviewed the patient's current medications.  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   CTs of the chest, abdomen, and pelvis on 06/02/2013 revealed an appendix mass and lymphadenopathy in the ileocecal mesentery.   Colonoscopy 06/12/2013 with a cecal/appendiceal orifice mass, status post a biopsy confirming poorly differentiated adenocarcinoma with signet ring cell features.   Initiation of CAPOX 07/15/2013.  Avastin added with cycle 2.  Cycle 5 CAPOX/Avastin 10/07/2013.  Restaging CT evaluation (done at [facility] 11/07/2013 showed increase in the metastatic implants in the peritoneum in the abdomen and pelvis. Multiple enlarged ileocolic lymph nodes. The appendix was again seen to be enlarged filled with soft tissue and measuring up to 2.4 cm. Multiple nodules up to 5 mm throughout the right and left lung not present on the previous study.  Oncology care subsequently transferred to [facility] in [place], then FOLFIRI/cyramza; restaging CT evaluation 07/21/2014 with progression of pulmonary metastasis; interval progression of omental and mesenteric carcinomatosis with interval development of ascites. Office [name] from [place] on 08/24/2014 indicates she was seen by surgery and not a candidate for CRS/HIPEC; Palliative stivarga discussed.  CT abdomen/pelvis 09/22/2014 showed peritoneal carcinomatosis with extensive tumor throughout the peritoneal cavity including a large 16.8 cm diameter mass in the pelvis. Associated omental caking, mesenteric adenopathy and anterior abdominal wall tumor extension. Multiple pulmonary metastases and new small left pleural  effusion. Question single metastasis at the lateral segment left lower lobe, new. Large area of low attenuation within the anterior inferior spleen. Bladder wall invasion by tumor at the superior margin of the urinary bladder could not be excluded. 2. Cesarean section delivery of her second child 05/20/2013  3. Anemia-potentially related to pregnancy/surgical blood loss versus a malignancy.  4. Delayed nausea following cycle 1 CAPOX. Aloxi and Emend added beginning with cycle 2.  5. Elevated liver enzymes 08/27/2013. Stable elevation 09/16/2013. Improved 10/07/2013.  6. Oxaliplatin neuropathy. Moderate decrease in vibratory sense over the fingertips on the right hand 10/07/2013. 7. Hypertension potentially related to Avastin. 8. 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 Costello continues to have a poor performance status. She and her husband understand that she is not a candidate for systemic therapy in her current condition. Dr. Benay Spice recommends hospice care. At this time they decline a hospice referral.  For pain control she will increase MS Contin to 30 mg every 12 hours. For breakthrough pain she will take Dilaudid 8 mg every 3 hours as needed.  She appears to have recurrent ascites. We are referring her for a therapeutic paracentesis today.  For the constipation she will begin daily Miralax and a stool softener.  She will return for a follow-up visit in one week. She and her husband understand to contact the office in the interim with further problems.  Patient seen with Dr. Benay Spice. CT images were reviewed on the computer with Ms. Niebuhr and her husband. 30 minutes were spent face-to-face at today's visit with the majority of that time involved in counseling/coordination of care.    Ned Card ANP/GNP-BC   09/22/2014  12:49 PM  This was  a shared visit with Ned Card. Ms. Dukeman was interviewed and examined. I reviewed recent CT images with Ms. Abdou and her  husband.  Her performance status is poor. We adjusted the narcotic pain regimen and she will be referred for a palliative paracentesis. She is not a candidate for salvage systemic therapy. I will ask radiation oncology to review the most recent CT to see if she may benefit from palliative pelvic radiation.  Ms. Neira will return for an office visit in one week.  Julieanne Manson, M.D.

## 2014-09-22 NOTE — ED Notes (Signed)
Pt states she has appendix ca, states her pain medication usually helps but has not, been having more severe abdominal pain since 11 pm last night, nausea w/ no vomiting or diarrhea, also states she has not had a BM x 3 weeks, states she does not eat much to have a BM, took miralax w/ no relief.

## 2014-09-22 NOTE — ED Notes (Signed)
Pt complains of abdominal pain since about 11pm last night, very nauseated no vomiting

## 2014-09-22 NOTE — ED Notes (Signed)
Patient transported to CT 

## 2014-09-22 NOTE — Procedures (Signed)
  US guided LLQ para  1.9 liters--yellow fluid  Tolerated well

## 2014-09-22 NOTE — Progress Notes (Signed)
Brief nutrition follow-up in exam room. Patient feels miserable and is hoping she can have paracentesis today. Patient was diagnosed with appendix cancer after C-section in February 2015. Per patient.  She weighed 202 pounds before pregnancy. Current weight documented as 127.7 pounds June 21. Patient reports nausea with decreased oral intake. She generally can consume ensure or Carnation breakfast.  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  Intervention: Educated patient to consume small frequent highly concentrated nutrition as tolerated. Provided coupons for [employer] essentials and recipes for patient's family to prepare shakes as patient desires. Questions were answered.  Teach back method used.  Contact information was provided.  Monitoring, evaluation, goals: Patient will tolerate oral intake to promote improved quality-of-life.  Next visit: To be scheduled as needed.  **Disclaimer: This note was dictated with voice recognition software. Similar sounding words can inadvertently be transcribed and this note may contain transcription errors which may not have been corrected upon publication of note.**

## 2014-09-22 NOTE — Discharge Instructions (Signed)
Metastatic Cancer, Questions and Answers KEY POINTS  Cancer happens when cells become abnormal and grow without control.  Where the cancer started is called the primary cancer or the primary tumor.  Metastatic cancer happens when cancer cells spread from the place where it started to other parts of the body.  When cancer spreads, the metastatic cancer keeps the same type of cells and the same name as the primary tumor.  The most common sites of metastasis are the lungs, bones, liver, and brain.  Treatment for metastatic cancer usually depends on the type of cancer. It also depends on the size and location of the metastasis. WHAT IS CANCER?   Cancer is a group of many related diseases. All cancers begin in cells. Cells are the building blocks that make up tissues. Cancer that arises from organs and solid tissues is called a solid tumor. Cancer that begins in blood cells is called leukemia, multiple myeloma, or lymphoma.  Normally, cells grow and divide to form new cells as the body needs them. When cells grow old and die, new cells take their place. Sometimes this orderly process goes wrong. New cells form when the body does not need them. Old cells do not die when they should.  The extra cells form a mass of tissue. This is called a growth or tumor. Tumors can be either not cancerous (benign) or cancerous (malignant). Benign tumors do not spread to other parts of the body. They are rarely a threat to life. Malignant tumors can spread (metastasize) and may be life threatening. WHAT IS PRIMARY CANCER?  Cancer can begin in any organ or tissue of the body. The original tumor is called the primary cancer or primary tumor. It is usually named for the part of the body or the type of cell in which it begins. WHAT IS METASTASIS, AND HOW DOES IT HAPPEN?   Metastasis means the spread of cancer. Cancer cells can break away from a primary tumor and enter the bloodstream or lymphatic system. This is the  system that produces, stores, and carries the cells that fight infections. That is how cancer cells spread to other parts of the body.  When cancer cells spread and form a new tumor in a different organ, the new tumor is a metastatic tumor. The cells in the metastatic tumor come from the original tumor. For example, if breast cancer spreads to the lungs, the metastatic tumor in the lung is made up of cancerous breast cells. It is not made of lung cells. In this case, the disease in the lungs is metastatic breast cancer (not lung cancer). Under a microscope, metastatic breast cancer cells generally look the same as the cancer cells in the breast. [place]?   Cancer cells can spread to almost any part of the body. Cancer cells frequently spread to lymph nodes (rounded masses of lymphatic tissue) near the primary tumor (regional lymph nodes). This is called lymph node involvement or regional disease. Cancer that spreads to other organs or to lymph nodes far from the primary tumor is called metastatic disease. Caregivers sometimes also call this distant disease.  The most common sites of metastasis from solid tumors are the lungs, bones, liver, and brain. Some cancers tend to spread to certain parts of the body. For example, lung cancer often metastasizes to the brain or bones. Colon cancer often spreads to the liver. Prostate cancer tends to spread to the bones. Breast cancer commonly spreads to the bones, lungs, liver,  the liver. Prostate cancer tends to spread to the bones. Breast cancer commonly spreads to the bones, lungs, liver, or brain. But each of these cancers can spread to other parts of the body as well.  · Because blood cells travel throughout the body, leukemia, multiple myeloma, and lymphoma cells are usually not localized when the cancer is diagnosed. Tumor cells may be found in the blood, several lymph nodes, or other parts of the body such as the liver or bones. This type of spread is not referred to as metastasis.  ARE THERE SYMPTOMS OF METASTATIC CANCER?   · Some people with metastatic  cancer do not have symptoms. Their metastases are found by X-rays and other tests performed for other reasons.  · When symptoms of metastatic cancer occur, the type and frequency of the symptoms will depend on the size and location of the metastasis. For example, cancer that spreads to the bones is likely to cause pain and can lead to bone fractures. Cancer that spreads to the brain can cause a variety of symptoms. These include headaches, seizures, and unsteadiness. Shortness of breath may be a sign of lung involvement. Abdominal swelling or yellowing of the skin (jaundice) can indicate that cancer has spread to the liver.  · Sometimes a person's primary cancer is discovered only after the metastatic tumor causes symptoms. For example, a man whose prostate cancer has spread to the bones in his pelvis may have lower back pain (caused by the cancer in his bones) before he experiences any symptoms from the primary tumor in his prostate.  HOW DOES THE CAREGIVER KNOW WHETHER A CANCER IS PRIMARY OR A METASTATIC TUMOR?  · To determine whether a tumor is primary or metastatic, the tumor will be examined under a microscope. In general, cancer cells look like abnormal versions of cells in the tissue where the cancer began. Using specialized diagnostic tests, a trained person is often able to tell where the cancer cells came from. Markers or antigens found in or on the cancer cells can indicate the primary site of the cancer.  · Metastatic cancers may be found before or at the same time as the primary tumor, or months or years later. When a new tumor is found in a patient who has been treated for cancer in the past, it is more often a metastasis than another primary tumor.  IS IT POSSIBLE TO HAVE A METASTATIC TUMOR WITHOUT HAVING A PRIMARY CANCER?   No. A metastatic tumor always starts from cancer cells in another part of the body. In most cases, when a metastatic tumor is found first, the primary tumor can be found. The  search for the primary tumor may involve lab tests, X-rays, and other procedures. However, in a small number of cases, a metastatic tumor is diagnosed but the primary tumor cannot be found, in spite of extensive tests. The tumor is metastatic because the cells are not like those in the organ or tissue in which the tumor is found. The primary tumor is called unknown or hidden (occult). The patient is said to have cancer of unknown primary origin (CUP). Because diagnostic techniques are constantly improving, the number of cases of CUP is going down.   WHAT TREATMENTS ARE USED FOR METASTATIC CANCER?   · When cancer has metastasized, it may be treated with:  ¨ Chemotherapy.  ¨ Radiation therapy.  ¨ Biological therapy.  ¨ Hormone therapy.  ¨ Surgery.  ¨ Cryosurgery.  ¨ A combination of these.  ·   The choice of treatment generally depends on the:  ¨ Type of primary cancer.  ¨ Size and location of the metastasis.  ¨ Patient's age and general health.  ¨ Types of treatments the patient has had in the past.  In patients with CUP, it is possible to treat the disease even though the primary tumor has not been located. The goal of treatment may be to control the cancer, or to relieve symptoms or side effects of treatment.  ARE NEW TREATMENTS FOR METASTATIC CANCER BEING DEVELOPED?   Yes, many new cancer treatments are under study. To develop new treatments, the NCI sponsors clinical trials (research studies) with cancer patients in many hospitals, universities, medical schools, and cancer centers around the country. Clinical trials are a critical step in the improvement of treatment. Before any new treatment can be recommended for general use, doctors conduct studies to find out whether the treatment is both safe for patients and effective against the disease. The results of such studies have led to progress not only in the treatment of cancer, but in the detection, diagnosis, and prevention of the disease as well. Patients  interested in taking part in a clinical trial should talk with their caregivers.  FOR MORE INFORMATION  National Cancer Institute (NCI): www.cancer.gov  Document Released: 07/25/2004 Document Revised: 06/12/2011 Document Reviewed: 03/12/2008  ExitCare® Patient Information ©2015 [employer]. This information is not intended to replace advice given to you by your health care provider. Make sure you discuss any questions you have with your health care provider.

## 2014-09-22 NOTE — Telephone Encounter (Signed)
TC from [place] in [place] ED. Pt is there d/t increased abd. Pain. Had CT scans-new liver, pelvic masses. Has an appt with Ned Card today . Verna Czech asking if it is ok to deacccess pt's portacath. Ok to deaccess-pt has not chemo etc scheduled for today.

## 2014-09-23 ENCOUNTER — Other Ambulatory Visit: Payer: Self-pay | Admitting: Nurse Practitioner

## 2014-09-23 ENCOUNTER — Telehealth: Payer: Self-pay | Admitting: *Deleted

## 2014-09-23 NOTE — Telephone Encounter (Signed)
Oncology Nurse Navigator Documentation  Oncology Nurse Navigator Flowsheets 09/23/2014  Navigator Encounter Type Telephone-F/U on pain control  Patient Visit Type -  Treatment Phase -  Barriers/Navigation Needs Education-Constipation management w/Miralax and colace daily. Increase to bid if necessary due to pain medications.  Time Spent with Patient 15  Reports her pain is better controlled today. Asking about her follow up appointment. Not yet scheduled. Will send POF to scheduler per office note instructions.

## 2014-09-24 ENCOUNTER — Other Ambulatory Visit: Payer: Self-pay | Admitting: *Deleted

## 2014-09-24 ENCOUNTER — Telehealth: Payer: Self-pay | Admitting: Oncology

## 2014-09-24 DIAGNOSIS — C181 Malignant neoplasm of appendix: Secondary | ICD-10-CM

## 2014-09-24 DIAGNOSIS — C801 Malignant (primary) neoplasm, unspecified: Principal | ICD-10-CM

## 2014-09-24 DIAGNOSIS — C7989 Secondary malignant neoplasm of other specified sites: Secondary | ICD-10-CM

## 2014-09-24 NOTE — Telephone Encounter (Signed)
s.w. pt and advised on 6.28 appt....pt ok and aware °

## 2014-09-28 ENCOUNTER — Telehealth: Payer: Self-pay | Admitting: *Deleted

## 2014-09-28 NOTE — Telephone Encounter (Signed)
Pt called and left message wanting to know what to do about constipation before coming in for office visit on 09/29/14.  Spoke with pt and was informed that pt has not had bowel movement in about a month.  Pt is taking MS Contin 30mg   BID,  Dilaudid 8mg   Every 3 - 4 hours for pain.  Stated feeling bloated; eating some, drinking one ( 1 ) bottle of water per day per pt.  Pt wanted to know if she should take an enema today before office visit tomorrow. Dr. Benay Spice notified.  Called pt back and instructed pt to take Magnesium Citrate today - half bottle first, wait for 2 hours, if no results, then take rest of bottle.  Pt can also use enema. Pt will be evaluated further at office visit on 09/29/14 per Dr. Benay Spice.  Pt voiced understanding. Pt's  Phone    [phone].

## 2014-09-28 NOTE — Progress Notes (Addendum)
GI Location of Tumor / Histology: Metastatic adenocarcinoma with signet ring cell eatures  -gastrointestinal tumor ,large pelvic mass  Laura [name] presented months ago with symptoms of: increased abdominal pain,   Biopsies of  (if applicable) revealed:Diagnosis 06/12/13: Colon, biopsy, appendiceal orifice mass- INVASIVE POORLY DIFFERENTIATED ADENOCARCINOMA WITH SIGNET RING CELL FEATURES. Past/Anticipated interventions by surgeon, if any: 09/22/14 Recurrent Ascities: Paracentesis U/S guided LLQ para 1.9 Liters fluid=yellow,   Past/Anticipated interventions by medical oncology, if any: Dr. Benay Spice 09/22/14 recommends hospice referral, patient and spouse declined at this time,  referral for palliative pelvic radiation,   Weight changes, if any:   Bowel/Bladder complaints, if any:  Chronic constipation, put on miralax , and stool softner to begin passing gas,   Nausea / Vomiting, if any: intermittent nausea  Pain issues, if any:  poorly controlled abdominal pain, on dilaudid and morphine   SAFETY ISSUES:  Prior radiation? NO  Pacemaker/ICD? NO  Possible current pregnancy? NO  Is the patient on methotrexate?NO  Current Complaints / other details Married, 2 chldren, C-section 2nd child 05/20/13,

## 2014-09-29 ENCOUNTER — Ambulatory Visit: Payer: BLUE CROSS/BLUE SHIELD | Admitting: Nurse Practitioner

## 2014-09-29 ENCOUNTER — Emergency Department (HOSPITAL_COMMUNITY): Payer: BLUE CROSS/BLUE SHIELD

## 2014-09-29 ENCOUNTER — Encounter (HOSPITAL_COMMUNITY): Payer: Self-pay | Admitting: Emergency Medicine

## 2014-09-29 ENCOUNTER — Inpatient Hospital Stay (HOSPITAL_COMMUNITY)
Admission: EM | Admit: 2014-09-29 | Discharge: 2014-10-03 | DRG: 375 | Disposition: A | Discharge status: Hospice | Payer: BLUE CROSS/BLUE SHIELD | Attending: Internal Medicine | Admitting: Internal Medicine

## 2014-09-29 ENCOUNTER — Telehealth: Payer: Self-pay | Admitting: *Deleted

## 2014-09-29 DIAGNOSIS — C801 Malignant (primary) neoplasm, unspecified: Secondary | ICD-10-CM | POA: Diagnosis present

## 2014-09-29 DIAGNOSIS — E860 Dehydration: Secondary | ICD-10-CM | POA: Diagnosis present

## 2014-09-29 DIAGNOSIS — I10 Essential (primary) hypertension: Secondary | ICD-10-CM | POA: Diagnosis present

## 2014-09-29 DIAGNOSIS — C786 Secondary malignant neoplasm of retroperitoneum and peritoneum: Principal | ICD-10-CM | POA: Diagnosis present

## 2014-09-29 DIAGNOSIS — R000 Tachycardia, unspecified: Secondary | ICD-10-CM | POA: Diagnosis not present

## 2014-09-29 DIAGNOSIS — Z7189 Other specified counseling: Secondary | ICD-10-CM | POA: Insufficient documentation

## 2014-09-29 DIAGNOSIS — F411 Generalized anxiety disorder: Secondary | ICD-10-CM | POA: Diagnosis present

## 2014-09-29 DIAGNOSIS — D72829 Elevated white blood cell count, unspecified: Secondary | ICD-10-CM | POA: Diagnosis present

## 2014-09-29 DIAGNOSIS — T40605A Adverse effect of unspecified narcotics, initial encounter: Secondary | ICD-10-CM | POA: Diagnosis present

## 2014-09-29 DIAGNOSIS — K5909 Other constipation: Secondary | ICD-10-CM | POA: Diagnosis present

## 2014-09-29 DIAGNOSIS — R188 Other ascites: Secondary | ICD-10-CM | POA: Diagnosis present

## 2014-09-29 DIAGNOSIS — R52 Pain, unspecified: Secondary | ICD-10-CM | POA: Diagnosis not present

## 2014-09-29 DIAGNOSIS — L89322 Pressure ulcer of left buttock, stage 2: Secondary | ICD-10-CM | POA: Diagnosis present

## 2014-09-29 DIAGNOSIS — R109 Unspecified abdominal pain: Secondary | ICD-10-CM | POA: Diagnosis present

## 2014-09-29 DIAGNOSIS — E440 Moderate protein-calorie malnutrition: Secondary | ICD-10-CM | POA: Insufficient documentation

## 2014-09-29 DIAGNOSIS — R1084 Generalized abdominal pain: Secondary | ICD-10-CM | POA: Diagnosis not present

## 2014-09-29 DIAGNOSIS — C7802 Secondary malignant neoplasm of left lung: Secondary | ICD-10-CM | POA: Diagnosis present

## 2014-09-29 DIAGNOSIS — C787 Secondary malignant neoplasm of liver and intrahepatic bile duct: Secondary | ICD-10-CM | POA: Diagnosis present

## 2014-09-29 DIAGNOSIS — Z66 Do not resuscitate: Secondary | ICD-10-CM | POA: Diagnosis present

## 2014-09-29 DIAGNOSIS — Z515 Encounter for palliative care: Secondary | ICD-10-CM | POA: Diagnosis not present

## 2014-09-29 DIAGNOSIS — Z9221 Personal history of antineoplastic chemotherapy: Secondary | ICD-10-CM

## 2014-09-29 DIAGNOSIS — C7989 Secondary malignant neoplasm of other specified sites: Secondary | ICD-10-CM | POA: Diagnosis not present

## 2014-09-29 DIAGNOSIS — C7801 Secondary malignant neoplasm of right lung: Secondary | ICD-10-CM | POA: Diagnosis present

## 2014-09-29 DIAGNOSIS — Z6823 Body mass index (BMI) 23.0-23.9, adult: Secondary | ICD-10-CM | POA: Diagnosis not present

## 2014-09-29 DIAGNOSIS — G893 Neoplasm related pain (acute) (chronic): Secondary | ICD-10-CM | POA: Diagnosis present

## 2014-09-29 DIAGNOSIS — R112 Nausea with vomiting, unspecified: Secondary | ICD-10-CM | POA: Insufficient documentation

## 2014-09-29 DIAGNOSIS — L8990 Pressure ulcer of unspecified site, unspecified stage: Secondary | ICD-10-CM | POA: Insufficient documentation

## 2014-09-29 DIAGNOSIS — C181 Malignant neoplasm of appendix: Secondary | ICD-10-CM | POA: Diagnosis not present

## 2014-09-29 DIAGNOSIS — R180 Malignant ascites: Secondary | ICD-10-CM

## 2014-09-29 LAB — BASIC METABOLIC PANEL
Anion gap: 14 (ref 5–15)
BUN: 19 mg/dL (ref 6–20)
CHLORIDE: 99 mmol/L — AB (ref 101–111)
CO2: 28 mmol/L (ref 22–32)
Calcium: 9.3 mg/dL (ref 8.9–10.3)
Creatinine, Ser: 0.59 mg/dL (ref 0.44–1.00)
GFR calc Af Amer: >60 mL/min (ref >60)
GLUCOSE: 95 mg/dL (ref 65–99)
Potassium: 3.9 mmol/L (ref 3.5–5.1)
Sodium: 141 mmol/L (ref 135–145)

## 2014-09-29 LAB — CBC WITH DIFFERENTIAL/PLATELET
BASOS ABS: 0 10*3/uL (ref 0.0–0.1)
Basophils Relative: 0 % (ref 0–1)
EOS PCT: 1 % (ref 0–5)
Eosinophils Absolute: 0.1 10*3/uL (ref 0.0–0.7)
HCT: 41.5 % (ref 36.0–46.0)
Hemoglobin: 12.9 g/dL (ref 12.0–15.0)
LYMPHS PCT: 12 % (ref 12–46)
Lymphs Abs: 1.3 10*3/uL (ref 0.7–4.0)
MCH: 26.3 pg (ref 26.0–34.0)
MCHC: 31.1 g/dL (ref 30.0–36.0)
MCV: 84.5 fL (ref 78.0–100.0)
MONO ABS: 0.8 10*3/uL (ref 0.1–1.0)
Monocytes Relative: 7 % (ref 3–12)
Neutro Abs: 9.2 10*3/uL — ABNORMAL HIGH (ref 1.7–7.7)
Neutrophils Relative %: 80 % — ABNORMAL HIGH (ref 43–77)
PLATELETS: 261 10*3/uL (ref 150–400)
RBC: 4.91 MIL/uL (ref 3.87–5.11)
RDW: 16.9 % — AB (ref 11.5–15.5)
WBC: 11.4 10*3/uL — AB (ref 4.0–10.5)

## 2014-09-29 MED ORDER — IOHEXOL 300 MG/ML  SOLN
50.0000 mL | Freq: Once | INTRAMUSCULAR | Status: AC | PRN
  Administered 2014-09-29: 50 mL via ORAL

## 2014-09-29 MED ORDER — SODIUM CHLORIDE 0.9 % IJ SOLN: 3.0000 mL | Freq: Two times a day (BID) | INTRAMUSCULAR | Status: DC

## 2014-09-29 MED ORDER — HYDROMORPHONE HCL 1 MG/ML IJ SOLN
0.5000 mg | INTRAMUSCULAR | Status: DC | PRN
  Administered 2014-09-29 – 2014-09-30 (×6): 0.5 mg via INTRAVENOUS
  Filled 2014-09-29 (×6): qty 1

## 2014-09-29 MED ORDER — ONDANSETRON HCL 4 MG PO TABS: 4.0000 mg | ORAL_TABLET | Freq: Four times a day (QID) | ORAL | Status: DC | PRN

## 2014-09-29 MED ORDER — ACETAMINOPHEN 650 MG RE SUPP: 650.0000 mg | Freq: Four times a day (QID) | RECTAL | Status: DC | PRN

## 2014-09-29 MED ORDER — MAGNESIUM CITRATE PO SOLN: 1.0000 | Freq: Once | ORAL | Status: AC | PRN

## 2014-09-29 MED ORDER — ONDANSETRON HCL 4 MG/2ML IJ SOLN
4.0000 mg | Freq: Four times a day (QID) | INTRAMUSCULAR | Status: DC | PRN
  Administered 2014-09-29 – 2014-10-02 (×4): 4 mg via INTRAVENOUS
  Filled 2014-09-29 (×6): qty 2

## 2014-09-29 MED ORDER — VITAMIN B-1 100 MG PO TABS
100.0000 mg | ORAL_TABLET | Freq: Every day | ORAL | Status: DC
  Administered 2014-09-30: 100 mg via ORAL
  Filled 2014-09-29 (×2): qty 1

## 2014-09-29 MED ORDER — ONDANSETRON HCL 4 MG/2ML IJ SOLN
4.0000 mg | Freq: Once | INTRAMUSCULAR | Status: AC
  Administered 2014-09-29: 4 mg via INTRAVENOUS
  Filled 2014-09-29: qty 2

## 2014-09-29 MED ORDER — FOLIC ACID 1 MG PO TABS
1.0000 mg | ORAL_TABLET | Freq: Every day | ORAL | Status: DC
  Administered 2014-09-30: 1 mg via ORAL
  Filled 2014-09-29 (×2): qty 1

## 2014-09-29 MED ORDER — HYDROMORPHONE HCL 1 MG/ML IJ SOLN
1.0000 mg | Freq: Once | INTRAMUSCULAR | Status: AC
  Administered 2014-09-29: 1 mg via INTRAVENOUS
  Filled 2014-09-29: qty 1

## 2014-09-29 MED ORDER — SODIUM CHLORIDE 0.9 % IV BOLUS (SEPSIS)
1000.0000 mL | Freq: Once | INTRAVENOUS | Status: AC
  Administered 2014-09-29: 1000 mL via INTRAVENOUS

## 2014-09-29 MED ORDER — PREDNISONE 20 MG PO TABS
20.0000 mg | ORAL_TABLET | Freq: Every day | ORAL | Status: DC
  Administered 2014-09-30 – 2014-10-03 (×4): 20 mg via ORAL
  Filled 2014-09-29 (×5): qty 1

## 2014-09-29 MED ORDER — ACETAMINOPHEN 325 MG PO TABS: 650.0000 mg | ORAL_TABLET | Freq: Four times a day (QID) | ORAL | Status: DC | PRN

## 2014-09-29 MED ORDER — DOCUSATE SODIUM 100 MG PO CAPS
100.0000 mg | ORAL_CAPSULE | Freq: Two times a day (BID) | ORAL | Status: DC
  Filled 2014-09-29 (×2): qty 1

## 2014-09-29 MED ORDER — HEPARIN SODIUM (PORCINE) 5000 UNIT/ML IJ SOLN
5000.0000 [IU] | Freq: Three times a day (TID) | INTRAMUSCULAR | Status: DC
  Administered 2014-09-29 – 2014-10-01 (×5): 5000 [IU] via SUBCUTANEOUS
  Filled 2014-09-29 (×8): qty 1

## 2014-09-29 MED ORDER — POLYETHYLENE GLYCOL 3350 17 G PO PACK: 17.0000 g | PACK | Freq: Every day | ORAL | Status: DC | PRN

## 2014-09-29 MED ORDER — AMLODIPINE BESYLATE 10 MG PO TABS
10.0000 mg | ORAL_TABLET | Freq: Every day | ORAL | Status: DC
  Administered 2014-09-30 – 2014-10-02 (×3): 10 mg via ORAL
  Filled 2014-09-29 (×6): qty 1

## 2014-09-29 MED ORDER — SODIUM CHLORIDE 0.9 % IV SOLN
INTRAVENOUS | Status: DC
  Administered 2014-09-29 – 2014-10-01 (×3): via INTRAVENOUS

## 2014-09-29 MED ORDER — OXYCODONE HCL 5 MG PO TABS
5.0000 mg | ORAL_TABLET | ORAL | Status: DC | PRN
  Administered 2014-09-29 – 2014-10-01 (×4): 5 mg via ORAL
  Filled 2014-09-29 (×4): qty 1

## 2014-09-29 MED ORDER — ADULT MULTIVITAMIN W/MINERALS CH
1.0000 | ORAL_TABLET | Freq: Every day | ORAL | Status: DC
  Administered 2014-09-30: 1 via ORAL
  Filled 2014-09-29 (×2): qty 1

## 2014-09-29 NOTE — ED Notes (Signed)
Unable to collect labs patient wants her port access.  I made nurse aware.

## 2014-09-29 NOTE — ED Notes (Signed)
MD at bedside. 

## 2014-09-29 NOTE — H&P (Signed)
[facility] History and Physical  Laura [name] JJO:[id number] DOB: [birth year] DOA: 09/29/2014  Referring physician: Charlesetta Shanks, MD PCP: Laura Kim, MD   Chief Complaint: Abdominal Pain  HPI: Laura [name] is a 44 y.o. female with history of Metastatic Adenocarcinoma of the GI presents with abdominal pain. She has chronic pain from her tumor and presents today with worsening of her abdominal pain. She has been in the ED since about 12PM but has not found relief of the pain. As a result of usage of narcotics for her pain control she also has chronic constipation which has worsened somewhat. She has not had a BM for about a month she states. She had a CT scan done here in the ED and this shows extensive disease in the belly and also in the chest. She states that she last had chemo in March. The cancer center has discussed Palliative Care with her but she is not decided yet about this. She wants to be a FULL CODE at this time.   Review of Systems:  Constitutional:  +weight loss, no Fevers, chills, +fatigue.  HEENT:  No headaches, nosneezing, itching, ear ache, nasal congestion, post nasal drip,  Cardio-vascular:  No chest pain, +swelling in lower extremities  GI:  No heartburn, indigestion, +abdominal pain, +nausea, no vomiting, +loss of appetite  Resp:  No shortness of breath with exertion or at rest. No coughing up of blood.No change in color of mucus Skin:  no rash or lesions.  GU:  no dysuria, change in color of urine Musculoskeletal:  No joint pain or swelling Psych:  No change in mood or affect   Past Medical History  Diagnosis Date  . Medical history non-contributory   . Postpartum care following cesarean delivery (05/20/13) 05/20/2013  . S/P repeat low transverse C-section 05/20/2013  . Cancer 06-05-13    Dx. 1 week ago ? "appendix cancer"   Past Surgical History  Procedure Laterality Date  . Cesarean section    . Cesarean section N/A 05/20/2013    Procedure:  CESAREAN SECTION;  Surgeon: Laura Kim, MD;  Location: [facility];  Service: Obstetrics;  Laterality: N/A;  . Colonoscopy with propofol N/A 06/12/2013    Procedure: COLONOSCOPY WITH PROPOFOL;  Surgeon: Laura Banister, MD;  Location: [facility];  Service: Endoscopy;  Laterality: N/A;   Social History:  reports that she has never smoked. She has never used smokeless tobacco. She reports that she does not drink alcohol or use illicit drugs.  No Known Allergies  No family history on file.   Prior to Admission medications   Medication Sig Start Date End Date Taking? Authorizing Provider  amLODipine (NORVASC) 10 MG tablet Take 10 mg by mouth daily.   Yes Historical Provider, MD  HYDROmorphone (DILAUDID) 4 MG tablet Take 1-2 tablets (4-8 mg total) by mouth every 3 (three) hours as needed for severe pain. Patient taking differently: Take 4 mg by mouth every 4 (four) hours as needed for severe pain.  09/22/14  Yes Owens Shark, NP  morphine (MS CONTIN) 15 MG 12 hr tablet Take 30 mg by mouth every 12 (twelve) hours.   Yes Historical Provider, MD  ondansetron (ZOFRAN) 4 MG tablet Take 4 mg by mouth every 8 (eight) hours as needed for nausea or vomiting.   Yes Historical Provider, MD  OVER THE COUNTER MEDICATION Take 2 tablets by mouth at bedtime. Otc. Calms forte   Yes Historical Provider, MD  polyethylene glycol (MIRALAX / GLYCOLAX)  packet Take 17 g by mouth daily as needed for moderate constipation.    Yes Historical Provider, MD  predniSONE (DELTASONE) 20 MG tablet Take 1 tablet (20 mg total) by mouth daily with breakfast. 09/15/14  Yes Owens Shark, NP  amLODipine (NORVASC) 5 MG tablet Take 1 tablet (5 mg total) by mouth daily. Patient not taking: Reported on 09/29/2014 11/07/13   Owens Shark, NP  morphine (MS CONTIN) 30 MG 12 hr tablet Take 1 tablet (30 mg total) by mouth every 12 (twelve) hours. Patient not taking: Reported on 09/29/2014 09/22/14   Owens Shark, NP   Physical Exam: Filed  Vitals:   09/29/14 1438 09/29/14 1717 09/29/14 1900 09/29/14 1915  BP: 145/97 142/92 136/102 146/90  Pulse: 141 130 129 125  Temp:    98.2 F (36.8 C)  TempSrc:    Oral  Resp: 14 14 12 13   SpO2: 97% 99% 99% 98%    Wt Readings from Last 3 Encounters:  09/22/14 57.924 kg (127 lb 11.2 oz)  09/15/14 58.968 kg (130 lb)  11/07/13 80.922 kg (178 lb 6.4 oz)    General:  Appears anxious Eyes: PERRL, normal lids, irises & conjunctiva ENT: grossly normal hearing Neck: no LAD, masses or thyromegaly Cardiovascular: RRR, tachycardia. +LE edema Respiratory: CTA bilaterally, no w/r/r. Abdomen: distended +nonspecific tenderness Skin: no rash or induration seen on limited exam Musculoskeletal: grossly normal tone BUE/BLE Psychiatric: grossly flat affect Neurologic: grossly non-focal.          Labs on Admission:  Basic Metabolic Panel:  Recent Labs Lab 09/29/14 1531  NA 141  K 3.9  CL 99*  CO2 28  GLUCOSE 95  BUN 19  CREATININE 0.59  CALCIUM 9.3   Liver Function Tests: No results for input(s): AST, ALT, ALKPHOS, BILITOT, PROT, ALBUMIN in the last 168 hours. No results for input(s): LIPASE, AMYLASE in the last 168 hours. No results for input(s): AMMONIA in the last 168 hours. CBC:  Recent Labs Lab 09/29/14 1531  WBC 11.4*  NEUTROABS 9.2*  HGB 12.9  HCT 41.5  MCV 84.5  PLT 261   Cardiac Enzymes: No results for input(s): CKTOTAL, CKMB, CKMBINDEX, TROPONINI in the last 168 hours.  BNP (last 3 results) No results for input(s): BNP in the last 8760 hours.  ProBNP (last 3 results) No results for input(s): PROBNP in the last 8760 hours.  CBG: No results for input(s): GLUCAP in the last 168 hours.  Radiological Exams on Admission: Ct Abdomen Pelvis Wo Contrast  09/29/2014   CLINICAL DATA:  Constipation. 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  EXAM: CT ABDOMEN AND PELVIS WITHOUT CONTRAST  TECHNIQUE: Multidetector CT imaging of the abdomen and pelvis was performed  following the standard protocol without IV contrast.  COMPARISON:  09/22/2014  FINDINGS: Lower chest: Pulmonary metastasis. Index 1.0 cm nodule on image 6 measured 9 mm on the prior. Normal heart size without pericardial or pleural effusion.  Hepatobiliary: Degraded evaluation of the abdomen secondary to lack of IV contrast and beam hardening artifact from extensive overlying support apparatus. Grossly normal liver, gallbladder, and biliary tract.  Pancreas: Pancreatic atrophy, without acute inflammation.  Spleen: Similar appearance of multi focal splenic infarcts.  Adrenals/Urinary Tract: Normal adrenal glands. No renal calculi or hydronephrosis. Normal urinary bladder.  Stomach/Bowel: Normal caliber of the stomach. The sigmoid is decompressed, possibly secondary to mass effect by pelvic tumor. The remainder of the colon is stool and gas filled. Large amount of stool throughout the remainder of  the colon. In the region of the hepatic flexure, measures 6.5 cm today versus 5.3 cm on 09/22/2014. No small bowel obstruction, pneumatosis, or free intraperitoneal air.  Vascular/Lymphatic: Normal caliber of the aorta and branch vessels. Retroperitoneum not well evaluated for adenopathy. None present on recent contrast-enhanced study. Right external iliac adenopathy at 1.4 cm on image 70, similar.  Reproductive: Uterus not well evaluated.  Other: Pelvic mass or conglomerate masses again identified. On the order of 9.2 x 11.7 cm on image 70 of series 2. Felt to be similar to 09/22/2014 (when remeasured). Slight increase in loculated fluid anterior to the urinary bladder, including on image 74. Widespread omental and peritoneal metastasis throughout the abdomen, better evaluated on recent contrast-enhanced CT. Example right omental abdominal wall mass of 7.6 cm on image 54 versus 7.1 cm on the prior exam (when remeasured).  Left rectus muscle mass is similar at the 3.4 x 7.0 cm.  Musculoskeletal: Vague T10 increased density,  suspicious for osseous metastasis. Other areas of heterogeneous marrow density including within the L5 vertebral body are indeterminate but suspicious.  IMPRESSION: 1. mild degradation secondary to motion and lack of IV contrast. 2. Large volume abdominal pelvic tumor, similar and slightly increased, with mass effect upon the sigmoid colon and increase in more proximal colonic diameter. Findings suggest constipation and possibly a component of colonic obstruction at the level of the sigmoid. No small bowel obstruction. 3. Pulmonary metastasis, likely progressive since 09/22/2014. 4. Similar volume of ascites. 5. Grossly similar splenic infarcts. 6. Suspicion of osseous metastasis.   Electronically Signed   By: Abigail Miyamoto M.D.   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 Abdominal Pain -secondary to mets through out the abdomen -being admitted for pain control -needs palliative care evaluation  2. Metastatic Adenocarcinoma from GI site with increased Tumor Burden -will need follow up with oncology -palliative care consult ordered  3. HTN -will continue with amlodipine -monitor pressures  4. Tachycardia -likely due to pain -will hydrate as tolerated   Code Status: Full Code (must indicate code status--if unknown or must be presumed, indicate so) DVT Prophylaxis:Heparin Family Communication: none (indicate person spoken with, if applicable, with phone number if by telephone) Disposition Plan: Home (indicate anticipated LOS)  Time spent: 18min  KHAN,SAADAT A [facility] Pager [phone]

## 2014-09-29 NOTE — Telephone Encounter (Signed)
TC from pt's husband this am. He states he called yesterday regarding pt's lack of BM in 4 weeks.  He was instructed to give her an enema yesterday which he did give. He states there were no results.  He states her abd pain is worse today and still no BM. Denies vomiting but is nauseous. Pt has appt with Ned Card, NP @ 2:45 pm. Instructed husband to take pt to ED for evaluation, r/o obstruction. Spoke with Ned Card, NP and made her aware of situation.

## 2014-09-29 NOTE — ED Notes (Signed)
Per patient, states no BM for a month-was told to come here by cancer MD for eval-no relief from laxatives/stool softener

## 2014-09-29 NOTE — ED Provider Notes (Signed)
CSN: [id number]     Arrival date & time 09/29/14  1216 History   First MD Initiated Contact with Patient 09/29/14 1302     Chief Complaint  Patient presents with  . Constipation     (Consider location/radiation/quality/duration/timing/severity/associated sxs/prior Treatment) Patient is a 44 y.o. female presenting with constipation.  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 Medical history non-contributory   . Postpartum care following cesarean delivery (05/20/13) 05/20/2013  . S/P repeat low transverse C-section 05/20/2013  . Cancer 06-05-13    Dx. 1 week ago ? "appendix cancer"   Past Surgical History  Procedure Laterality Date  . Cesarean section    . Cesarean section N/A 05/20/2013    Procedure: CESAREAN SECTION;  Surgeon: [name], MD;  Location: [facility];  Service: Obstetrics;  Laterality: N/A;  . Colonoscopy with propofol N/A 06/12/2013    Procedure: COLONOSCOPY WITH PROPOFOL;  Surgeon: Milus Banister, MD;  Location: [facility];  Service: Endoscopy;  Laterality: N/A;   No family history on file. History  Substance Use Topics  . Smoking status: Never Smoker   . Smokeless tobacco: Never Used  . Alcohol Use: No   OB History    Gravida Para Term Preterm AB TAB SAB Ectopic Multiple Living   2 2 1 1      2      Review of Systems  Constitutional: Negative for fever.  Gastrointestinal: Positive for nausea, abdominal pain (chronically) and constipation. Negative for vomiting.  All other systems reviewed and are  negative.     Allergies  Review of patient's allergies indicates no known allergies.  Home Medications   Prior to Admission medications   Medication Sig Start Date End Date Taking? Authorizing Provider  amLODipine (NORVASC) 10 MG tablet Take 10 mg by mouth daily.   Yes Historical Provider, MD  HYDROmorphone (DILAUDID) 4 MG tablet Take 1-2 tablets (4-8 mg total) by mouth every 3 (three) hours as needed for severe pain. Patient taking differently: Take 4 mg by mouth every 4 (four) hours as needed for severe pain.  09/22/14  Yes Owens Shark, NP  morphine (MS CONTIN) 15 MG 12 hr tablet Take 30 mg by mouth every 12 (twelve) hours.   Yes Historical Provider, MD  ondansetron (ZOFRAN) 4 MG tablet Take 4 mg by mouth every 8 (eight) hours as needed for nausea or vomiting.   Yes Historical Provider, MD  OVER THE COUNTER MEDICATION Take 2 tablets by mouth at bedtime. Otc. Calms forte   Yes Historical Provider, MD  polyethylene glycol (MIRALAX / GLYCOLAX) packet Take 17 g by mouth daily as needed for moderate constipation.    Yes Historical Provider, MD  predniSONE (DELTASONE) 20 MG tablet Take 1 tablet (20 mg total) by mouth daily with breakfast. 09/15/14  Yes Owens Shark, NP  amLODipine (NORVASC) 5 MG tablet Take 1 tablet (5 mg total) by mouth daily. Patient not taking: Reported on 09/29/2014 11/07/13   Owens Shark, NP  morphine (MS CONTIN) 30 MG 12 hr tablet Take 1 tablet (30 mg total) by mouth  every 12 (twelve) hours. Patient not taking: Reported on 09/29/2014 09/22/14   Owens Shark, NP   BP 155/101 mmHg  Pulse 146  Temp(Src) 97.7 F (36.5 C) (Oral)  Resp 18  Ht 5\' 2"  (1.575 m)  Wt 127 lb 8 oz (57.834 kg)  BMI 23.31 kg/m2  SpO2 100% Physical Exam  Constitutional: She is oriented to person, place, and time. She appears well-developed and well-nourished.  HENT:  Head: Normocephalic and atraumatic.  Right Ear: External ear normal.  Left Ear: External ear normal.  Eyes: Conjunctivae and  EOM are normal. Pupils are equal, round, and reactive to light.  Neck: Normal range of motion. Neck supple.  Cardiovascular: Regular rhythm, normal heart sounds and intact distal pulses.  Tachycardia present.   Pulmonary/Chest: Effort normal and breath sounds normal.  Abdominal: Soft. Bowel sounds are normal. There is no tenderness.  Genitourinary:  Very small amount of stool in rectal vault  Musculoskeletal: Normal range of motion.  Neurological: She is alert and oriented to person, place, and time.  Skin: Skin is warm and dry.  Vitals reviewed.   ED Course  Procedures (including critical care time) Labs Review Labs Reviewed  CBC WITH DIFFERENTIAL/PLATELET - Abnormal; Notable for the following:    WBC 11.4 (*)    RDW 16.9 (*)    Neutrophils Relative % 80 (*)    Neutro Abs 9.2 (*)    All other components within normal limits  BASIC METABOLIC PANEL - Abnormal; Notable for the following:    Chloride 99 (*)    All other components within normal limits  COMPREHENSIVE METABOLIC PANEL - Abnormal; Notable for the following:    Calcium 8.6 (*)    Total Protein 6.3 (*)    Albumin 3.0 (*)    AST 52 (*)    ALT 70 (*)    Alkaline Phosphatase 260 (*)    Total Bilirubin 1.9 (*)    All other components within normal limits  CBC - Abnormal; Notable for the following:    WBC 11.9 (*)    RDW 17.0 (*)    All other components within normal limits  GLUCOSE, CAPILLARY    Imaging Review Ct Abdomen Pelvis Wo Contrast  09/29/2014   CLINICAL DATA:  Constipation. 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  EXAM: CT ABDOMEN AND PELVIS WITHOUT CONTRAST  TECHNIQUE: Multidetector CT imaging of the abdomen and pelvis was performed following the standard protocol without IV contrast.  COMPARISON:  09/22/2014  FINDINGS: Lower chest: Pulmonary metastasis. Index 1.0 cm nodule on image 6 measured 9 mm on the prior. Normal heart size without pericardial or pleural effusion.  Hepatobiliary: Degraded evaluation of  the abdomen secondary to lack of IV contrast and beam hardening artifact from extensive overlying support apparatus. Grossly normal liver, gallbladder, and biliary tract.  Pancreas: Pancreatic atrophy, without acute inflammation.  Spleen: Similar appearance of multi focal splenic infarcts.  Adrenals/Urinary Tract: Normal adrenal glands. No renal calculi or hydronephrosis. Normal urinary bladder.  Stomach/Bowel: Normal caliber of the stomach. The sigmoid is decompressed, possibly secondary to mass effect by pelvic tumor. The remainder of the colon is stool and gas filled. Large amount of stool throughout the remainder of the colon. In the region of the hepatic flexure, measures 6.5 cm today versus 5.3 cm on 09/22/2014. No small bowel obstruction, pneumatosis, or free intraperitoneal air.  Vascular/Lymphatic: Normal caliber of the aorta and branch vessels. Retroperitoneum not well evaluated for adenopathy. None present on recent contrast-enhanced study. Right external iliac  adenopathy at 1.4 cm on image 70, similar.  Reproductive: Uterus not well evaluated.  Other: Pelvic mass or conglomerate masses again identified. On the order of 9.2 x 11.7 cm on image 70 of series 2. Felt to be similar to 09/22/2014 (when remeasured). Slight increase in loculated fluid anterior to the urinary bladder, including on image 74. Widespread omental and peritoneal metastasis throughout the abdomen, better evaluated on recent contrast-enhanced CT. Example right omental abdominal wall mass of 7.6 cm on image 54 versus 7.1 cm on the prior exam (when remeasured).  Left rectus muscle mass is similar at the 3.4 x 7.0 cm.  Musculoskeletal: Vague T10 increased density, suspicious for osseous metastasis. Other areas of heterogeneous marrow density including within the L5 vertebral body are indeterminate but suspicious.  IMPRESSION: 1. mild degradation secondary to motion and lack of IV contrast. 2. Large volume abdominal pelvic tumor, similar and  slightly increased, with mass effect upon the sigmoid colon and increase in more proximal colonic diameter. Findings suggest constipation and possibly a component of colonic obstruction at the level of the sigmoid. No small bowel obstruction. 3. Pulmonary metastasis, likely progressive since 09/22/2014. 4. Similar volume of ascites. 5. Grossly similar splenic infarcts. 6. Suspicion of osseous metastasis.   Electronically Signed   By: Abigail Miyamoto M.D.   On: 09/29/2014 17:31     EKG Interpretation   Date/Time:  Tuesday September 29 2014 16:57:08 EDT Ventricular Rate:  127 PR Interval:  102 QRS Duration: 81 QT Interval:  395 QTC Calculation: 574 R Axis:   -12 Text Interpretation:  Sinus tachycardia Probable anterior infarct, age  indeterminate Prolonged QT interval ED PHYSICIAN INTERPRETATION AVAILABLE  IN [place] Confirmed by TEST, Record ([id number]) on 09/30/2014 8:10:35  AM      MDM   Final diagnoses:  None    44 y.o. female with pertinent PMH of metastatic appendiceal ca with peritoneal carcinomatosis presents with constipation, last BM 1 month ago.  Seen for similar symptoms recently with unremarkable wu, however has begun to have nausea without vomiting.  No change in chronic abd pain.  Physical exam on arrival as above, no obvious impaction.  Wu with planned CT scan, pt care to Dr. Johnney Killian pending final dispo.  I have reviewed all laboratory and imaging studies if ordered as above  1. Dehydration     Debby Freiberg, MD 09/30/14 [phone]

## 2014-09-29 NOTE — ED Provider Notes (Signed)
I have assumed care for Dr. Colin Rhein. I have continued pain management with Dilaudid for the patient. CT results do show increasing tumor burden with constipation and possible obstruction. The patient remains tachycardic despite fluid hydration. Her mental status is clear. She has no respiratory distress. Consultation has been made to the hospitalist for admission for pain control and hydration.  Charlesetta Shanks, MD 09/29/14 [phone]

## 2014-09-30 ENCOUNTER — Ambulatory Visit: Admission: RE | Admit: 2014-09-30 | Payer: BLUE CROSS/BLUE SHIELD | Source: Ambulatory Visit

## 2014-09-30 ENCOUNTER — Ambulatory Visit
Admission: RE | Admit: 2014-09-30 | Discharge: 2014-09-30 | Disposition: A | Discharge status: Home / Self Care | Payer: BLUE CROSS/BLUE SHIELD | Source: Ambulatory Visit | Attending: Radiation Oncology | Admitting: Radiation Oncology

## 2014-09-30 ENCOUNTER — Ambulatory Visit: Payer: BLUE CROSS/BLUE SHIELD | Admitting: Radiation Oncology

## 2014-09-30 ENCOUNTER — Encounter: Payer: Self-pay | Admitting: Radiation Oncology

## 2014-09-30 DIAGNOSIS — Z515 Encounter for palliative care: Secondary | ICD-10-CM | POA: Insufficient documentation

## 2014-09-30 DIAGNOSIS — E440 Moderate protein-calorie malnutrition: Secondary | ICD-10-CM | POA: Insufficient documentation

## 2014-09-30 DIAGNOSIS — L8990 Pressure ulcer of unspecified site, unspecified stage: Secondary | ICD-10-CM | POA: Insufficient documentation

## 2014-09-30 DIAGNOSIS — C786 Secondary malignant neoplasm of retroperitoneum and peritoneum: Principal | ICD-10-CM

## 2014-09-30 DIAGNOSIS — C181 Malignant neoplasm of appendix: Secondary | ICD-10-CM

## 2014-09-30 DIAGNOSIS — R112 Nausea with vomiting, unspecified: Secondary | ICD-10-CM | POA: Insufficient documentation

## 2014-09-30 LAB — CBC
HCT: 40.2 % (ref 36.0–46.0)
Hemoglobin: 12.5 g/dL (ref 12.0–15.0)
MCH: 26.5 pg (ref 26.0–34.0)
MCHC: 31.1 g/dL (ref 30.0–36.0)
MCV: 85.4 fL (ref 78.0–100.0)
Platelets: 239 10*3/uL (ref 150–400)
RBC: 4.71 MIL/uL (ref 3.87–5.11)
RDW: 17 % — AB (ref 11.5–15.5)
WBC: 11.9 10*3/uL — ABNORMAL HIGH (ref 4.0–10.5)

## 2014-09-30 LAB — COMPREHENSIVE METABOLIC PANEL
ALBUMIN: 3 g/dL — AB (ref 3.5–5.0)
ALK PHOS: 260 U/L — AB (ref 38–126)
ALT: 70 U/L — ABNORMAL HIGH (ref 14–54)
ANION GAP: 13 (ref 5–15)
AST: 52 U/L — ABNORMAL HIGH (ref 15–41)
BUN: 17 mg/dL (ref 6–20)
CO2: 25 mmol/L (ref 22–32)
Calcium: 8.6 mg/dL — ABNORMAL LOW (ref 8.9–10.3)
Chloride: 104 mmol/L (ref 101–111)
Creatinine, Ser: 0.51 mg/dL (ref 0.44–1.00)
GFR calc Af Amer: >60 mL/min (ref >60)
GFR calc non Af Amer: >60 mL/min (ref >60)
Glucose, Bld: 95 mg/dL (ref 65–99)
POTASSIUM: 3.5 mmol/L (ref 3.5–5.1)
SODIUM: 142 mmol/L (ref 135–145)
Total Bilirubin: 1.9 mg/dL — ABNORMAL HIGH (ref 0.3–1.2)
Total Protein: 6.3 g/dL — ABNORMAL LOW (ref 6.5–8.1)

## 2014-09-30 LAB — GLUCOSE, CAPILLARY: Glucose-Capillary: 88 mg/dL (ref 65–99)

## 2014-09-30 MED ORDER — HYDROMORPHONE HCL 2 MG/ML IJ SOLN
2.0000 mg | INTRAMUSCULAR | Status: DC | PRN
  Administered 2014-09-30 – 2014-10-01 (×5): 2 mg via INTRAVENOUS
  Filled 2014-09-30 (×4): qty 1

## 2014-09-30 MED ORDER — HYDROMORPHONE HCL 1 MG/ML IJ SOLN
1.0000 mg | INTRAMUSCULAR | Status: DC | PRN
  Administered 2014-09-30: 1 mg via INTRAVENOUS
  Filled 2014-09-30: qty 1

## 2014-09-30 MED ORDER — SODIUM CHLORIDE 0.9 % IJ SOLN: 10.0000 mL | Freq: Two times a day (BID) | INTRAMUSCULAR | Status: DC

## 2014-09-30 MED ORDER — SENNOSIDES-DOCUSATE SODIUM 8.6-50 MG PO TABS
2.0000 | ORAL_TABLET | Freq: Two times a day (BID) | ORAL | Status: DC
  Administered 2014-09-30 – 2014-10-03 (×7): 2 via ORAL
  Filled 2014-09-30 (×9): qty 2

## 2014-09-30 MED ORDER — ENSURE ENLIVE PO LIQD
237.0000 mL | Freq: Two times a day (BID) | ORAL | Status: DC
  Administered 2014-09-30 – 2014-10-03 (×4): 237 mL via ORAL

## 2014-09-30 MED ORDER — POLYETHYLENE GLYCOL 3350 17 G PO PACK
17.0000 g | PACK | Freq: Two times a day (BID) | ORAL | Status: DC
  Administered 2014-09-30 – 2014-10-02 (×6): 17 g via ORAL
  Filled 2014-09-30 (×9): qty 1

## 2014-09-30 MED ORDER — PROMETHAZINE HCL 25 MG/ML IJ SOLN
12.5000 mg | Freq: Once | INTRAMUSCULAR | Status: AC
  Administered 2014-09-30: 12.5 mg via INTRAVENOUS
  Filled 2014-09-30: qty 1

## 2014-09-30 MED ORDER — HYDROMORPHONE HCL 2 MG/ML IJ SOLN
2.0000 mg | Freq: Four times a day (QID) | INTRAMUSCULAR | Status: DC
  Administered 2014-09-30 – 2014-10-01 (×4): 2 mg via INTRAVENOUS
  Filled 2014-09-30 (×5): qty 1

## 2014-09-30 MED ORDER — ONDANSETRON HCL 4 MG/2ML IJ SOLN
4.0000 mg | Freq: Four times a day (QID) | INTRAMUSCULAR | Status: AC
  Administered 2014-09-30 – 2014-10-01 (×6): 4 mg via INTRAVENOUS
  Filled 2014-09-30 (×4): qty 2

## 2014-09-30 MED ORDER — SODIUM CHLORIDE 0.9 % IJ SOLN: 10.0000 mL | INTRAMUSCULAR | Status: DC | PRN

## 2014-09-30 NOTE — Progress Notes (Signed)
Initiated soap suds enema.  Pt did not tolerate well.  Only able to hold 200cc of fluid then it started to come back out.  Pt was not able to have bm.  Irven Baltimore, RN

## 2014-09-30 NOTE — Progress Notes (Signed)
Radiation Oncology         ([phone]) [phone] ________________________________  Name: Laura [name] MRN: [id number]  Date: 09/30/2014  DOB: 1970-09-15  ER:DEYCXK,GYJEHUD J, MD  Ladell Pier, MD     REFERRING PHYSICIAN: Ladell Pier, MD  DIAGNOSIS: metastatic appendiceal carcinoma    HISTORY OF PRESENT ILLNESS::Laura [name] is a 44 y.o. female who is seen for an initial consultation visit regarding the patient's diagnosis of appendiceal adenocarcinoma.  The patient was admitted in the hospital with increasingly intense abdominal pain. She has not had a bowel movement in 5 weeks.   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   CTs of the chest, abdomen, and pelvis on 06/02/2013 revealed an appendix mass and lymphadenopathy in the ileocecal mesentery.   Colonoscopy 06/12/2013 with a cecal/appendiceal orifice mass, status post a biopsy confirming poorly differentiated adenocarcinoma with signet ring cell features.   Initiation of CAPOX 07/15/2013.  Avastin added with cycle 2.  Cycle 5 CAPOX/Avastin 10/07/2013.  Restaging CT evaluation (done at [facility] 11/07/2013 showed increase in the metastatic implants in the peritoneum in the abdomen and pelvis. Multiple enlarged ileocolic lymph nodes. The appendix was again seen to be enlarged filled with soft tissue and measuring up to 2.4 cm. Multiple nodules up to 5 mm throughout the right and left lung not present on the previous study.  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 Office note from [facility] on  08/24/2014 indicates she was seen by surgery and not a candidate for CRS/HIPEC; Palliative stivarga discussed.  CT abdomen/pelvis 09/22/2014 showed peritoneal carcinomatosis with extensive tumor throughout the peritoneal cavity including a large 16.8 cm diameter mass in the pelvis. Associated omental caking, mesenteric adenopathy and anterior abdominal wall tumor extension. Multiple pulmonary metastases and new small left pleural effusion. Question single metastasis at the lateral segment left lower lobe, new. Large area of low attenuation within the anterior inferior spleen. Bladder wall invasion by tumor at the superior margin of the urinary bladder could not be excluded.    PREVIOUS RADIATION THERAPY: No   PAST MEDICAL HISTORY:  has a past medical history of Medical history non-contributory; Postpartum care following cesarean delivery (05/20/13) (05/20/2013); S/P repeat low transverse C-section (05/20/2013); and Cancer (06-05-13).     PAST SURGICAL HISTORY: Past Surgical History  Procedure Laterality Date  . Cesarean section    . Cesarean section N/A 05/20/2013    Procedure: CESAREAN SECTION;  Surgeon: [name], MD;  Location: [facility];  Service: Obstetrics;  Laterality: N/A;  . Colonoscopy with propofol N/A 06/12/2013    Procedure: COLONOSCOPY WITH PROPOFOL;  Surgeon: Milus Banister, MD;  Location: [facility];  Service: Endoscopy;  Laterality: N/A;     FAMILY HISTORY: family history is not on file.   SOCIAL HISTORY:  reports that she has never smoked. She has never used smokeless tobacco. She reports that she does not drink alcohol or use illicit drugs.   ALLERGIES: Review of patient's allergies indicates no known allergies.   MEDICATIONS:  No current facility-administered medications for this encounter.   No current outpatient prescriptions on file.   Facility-Administered Medications Ordered in Other Encounters  Medication Dose Route Frequency Provider Last Rate Last  Dose  . 0.9 %  sodium chloride infusion   Intravenous Continuous Allyne Gee, MD 50 mL/hr at 09/29/14 2045    . acetaminophen (TYLENOL) tablet 650 mg  650 mg Oral Q6H PRN Allyne Gee, MD       Or  . acetaminophen (TYLENOL) suppository 650 mg  650 mg Rectal Q6H PRN Allyne Gee, MD      . amLODipine (NORVASC) tablet 10 mg  10 mg Oral Daily Allyne Gee, MD   10 mg at 09/29/14 2105  . docusate sodium (COLACE) capsule 100 mg  100 mg Oral BID Allyne Gee, MD   100 mg at 09/29/14 2107  . folic acid (FOLVITE) tablet 1 mg  1 mg Oral Daily Allyne Gee, MD   1 mg at 09/29/14 2105  . heparin injection 5,000 Units  5,000 Units Subcutaneous 3 times per day Allyne Gee, MD   5,000 Units at 09/30/14 [phone]  . HYDROmorphone (DILAUDID) injection 0.5 mg  0.5 mg Intravenous Q2H PRN Allyne Gee, MD   0.5 mg at 09/30/14 3710  . multivitamin with minerals tablet 1 tablet  1 tablet Oral Daily Allyne Gee, MD   1 tablet at 09/29/14 2106  . ondansetron (ZOFRAN) tablet 4 mg  4 mg Oral Q6H PRN Allyne Gee, MD       Or  . ondansetron [facility]) injection 4 mg  4 mg Intravenous Q6H PRN Allyne Gee, MD   4 mg at 09/30/14 6269  . oxyCODONE (Oxy IR/ROXICODONE) immediate release tablet 5 mg  5 mg Oral Q4H PRN Allyne Gee, MD   5 mg at 09/29/14 2203  . polyethylene glycol (MIRALAX / GLYCOLAX) packet 17 g  17 g Oral Daily PRN Allyne Gee, MD      . predniSONE (DELTASONE) tablet 20 mg  20 mg Oral Q breakfast Allyne Gee, MD   20 mg at 09/30/14 4854  . sodium chloride 0.9 % injection 10-40 mL  10-40 mL Intracatheter Q12H Allyne Gee, MD      . sodium chloride 0.9 % injection 10-40 mL  10-40 mL Intracatheter PRN Allyne Gee, MD      . sodium chloride 0.9 % injection 3 mL  3 mL Intravenous Q12H Allyne Gee, MD   3 mL at 09/29/14 2106  . thiamine (VITAMIN B-1) tablet 100 mg  100 mg Oral Daily Allyne Gee, MD   100 mg at 09/29/14 2106     REVIEW OF SYSTEMS:  A 15 point review of systems is documented  in the electronic medical record. This was obtained by the nursing staff. However, I reviewed this with the patient to discuss relevant findings and make appropriate changes.  Pertinent items are noted in HPI.    PHYSICAL EXAM:  Vitals with Age-Percentiles 09[birth year]  Length   Systolic 093  Diastolic 818  MAP   Pulse 146  Respiration 18  Weight   BMI    Abdominal: Enlarged with diffuse tightness and tenderness    ECOG = 3  0 - Asymptomatic (Fully active, able to carry on all predisease activities without restriction)  1 - Symptomatic but completely ambulatory (Restricted in physically strenuous activity but ambulatory and able to carry out work of a light or sedentary nature. For example, light housework, office work)  2 - Symptomatic, <50% in bed during the day (Ambulatory and capable of all self care but unable to carry out any work  activities. Up and about more than 50% of waking hours)  3 - Symptomatic, >50% in bed, but not bedbound (Capable of only limited self-care, confined to bed or chair 50% or more of waking hours)  4 - Bedbound (Completely disabled. Cannot carry on any self-care. Totally confined to bed or chair)  5 - Death   Eustace Pen MM, Creech RH, Tormey DC, et al. [phone]). "Toxicity and response criteria of the [facility]". [place] Oncol. 5 (6): 649-55     LABORATORY DATA:  Lab Results  Component Value Date   WBC 11.9* 09/30/2014   HGB 12.5 09/30/2014   HCT 40.2 09/30/2014   MCV 85.4 09/30/2014   PLT 239 09/30/2014   Lab Results  Component Value Date   NA 142 09/30/2014   K 3.5 09/30/2014   CL 104 09/30/2014   CO2 25 09/30/2014   Lab Results  Component Value Date   ALT 70* 09/30/2014   AST 52* 09/30/2014   ALKPHOS 260* 09/30/2014   BILITOT 1.9* 09/30/2014      RADIOGRAPHY: Ct Abdomen Pelvis Wo Contrast  09/29/2014   CLINICAL DATA:  Constipation. 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  EXAM: CT ABDOMEN AND PELVIS  WITHOUT CONTRAST  TECHNIQUE: Multidetector CT imaging of the abdomen and pelvis was performed following the standard protocol without IV contrast.  COMPARISON:  09/22/2014  FINDINGS: Lower chest: Pulmonary metastasis. Index 1.0 cm nodule on image 6 measured 9 mm on the prior. Normal heart size without pericardial or pleural effusion.  Hepatobiliary: Degraded evaluation of the abdomen secondary to lack of IV contrast and beam hardening artifact from extensive overlying support apparatus. Grossly normal liver, gallbladder, and biliary tract.  Pancreas: Pancreatic atrophy, without acute inflammation.  Spleen: Similar appearance of multi focal splenic infarcts.  Adrenals/Urinary Tract: Normal adrenal glands. No renal calculi or hydronephrosis. Normal urinary bladder.  Stomach/Bowel: Normal caliber of the stomach. The sigmoid is decompressed, possibly secondary to mass effect by pelvic tumor. The remainder of the colon is stool and gas filled. Large amount of stool throughout the remainder of the colon. In the region of the hepatic flexure, measures 6.5 cm today versus 5.3 cm on 09/22/2014. No small bowel obstruction, pneumatosis, or free intraperitoneal air.  Vascular/Lymphatic: Normal caliber of the aorta and branch vessels. Retroperitoneum not well evaluated for adenopathy. None present on recent contrast-enhanced study. Right external iliac adenopathy at 1.4 cm on image 70, similar.  Reproductive: Uterus not well evaluated.  Other: Pelvic mass or conglomerate masses again identified. On the order of 9.2 x 11.7 cm on image 70 of series 2. Felt to be similar to 09/22/2014 (when remeasured). Slight increase in loculated fluid anterior to the urinary bladder, including on image 74. Widespread omental and peritoneal metastasis throughout the abdomen, better evaluated on recent contrast-enhanced CT. Example right omental abdominal wall mass of 7.6 cm on image 54 versus 7.1 cm on the prior exam (when remeasured).  Left  rectus muscle mass is similar at the 3.4 x 7.0 cm.  Musculoskeletal: Vague T10 increased density, suspicious for osseous metastasis. Other areas of heterogeneous marrow density including within the L5 vertebral body are indeterminate but suspicious.  IMPRESSION: 1. mild degradation secondary to motion and lack of IV contrast. 2. Large volume abdominal pelvic tumor, similar and slightly increased, with mass effect upon the sigmoid colon and increase in more proximal colonic diameter. Findings suggest constipation and possibly a component of colonic obstruction at the level of the sigmoid. No small bowel  obstruction. 3. Pulmonary metastasis, likely progressive since 09/22/2014. 4. Similar volume of ascites. 5. Grossly similar splenic infarcts. 6. Suspicion of osseous metastasis.   Electronically Signed   By: Abigail Miyamoto M.D.   On: 09/29/2014 17:31   Ct Abdomen Pelvis W Contrast  09/22/2014   CLINICAL DATA:  Severe abdominal pain and nausea since 20/3 higher hours on 09/21/2014, no relief with medications at home, no bowel movement for 3 weeks, history of carcinoma the appendix March 2015 post chemotherapy, prior paracentesis  EXAM: CT ABDOMEN AND PELVIS WITH CONTRAST  TECHNIQUE: Multidetector CT imaging of the abdomen and pelvis was performed using the standard protocol following bolus administration of intravenous contrast. Sagittal and coronal MPR images reconstructed from axial data set.  CONTRAST:  72mL OMNIPAQUE IOHEXOL 300 MG/ML SOLN IV. Dilute oral contrast.  COMPARISON:  06/06/2013, 08/16/2014  FINDINGS: Small LEFT pleural effusion.  Numerous pulmonary nodules at the lung bases compatible with multiple pulmonary metastases up to 9 mm diameter image 2.  Tip of Port-A-Cath in RIGHT atrium.  7 mm low-attenuation focus lateral segment LEFT lobe liver image 12.  Remainder of liver, pancreas, kidneys, and adrenal glands normal.  Large area of low attenuation at anterior/inferior aspect of spleen favor splenic  infarct over metaphysis unchanged from 08/16/2014.  Significant ascites with multiple soft tissue masses primarily in the mid abdomen and pelvis compatible with peritoneal carcinomatosis.  Conglomerate tumor mass in upper central pelvis measures 16.8 x 8.4 x 12.4 cm.  Soft tissue mass invades the rectus muscles of the anterior pelvis bilaterally greater on LEFT, LEFT rectus mass measuring 7.5 x 3.6 x 5.7 cm.  Pelvic tumor masses contiguous with superior aspect of urinary bladder cannot exclude bladder wall invasion.  Uterus and ovaries obscured by large pelvic mass.  Extensive mesenteric adenopathy and omental caking.  Stomach and bowel loops grossly unremarkable without evidence of obstruction.  No free intraperitoneal air, hernia, or acute osseous findings.  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  Associated omental caking, mesenteric adenopathy, and anterior abdominal wall tumor extension.  Multiple pulmonary metastases and new small LEFT pleural effusion.  Question single metastasis at lateral segment LEFT lobe liver, new.  Large area of low attenuation within the anterior inferior spleen favoring splenic infarct or sequela of prior intervention over metastasis.  Cannot exclude bladder wall invasion by tumor at the superior margin of the urinary bladder.  Findings called to Dr. Alvino Chapel on 09/22/2014 at 0920 hours.   Electronically Signed   By: Lavonia Dana M.D.   On: 09/22/2014 09:23   US Paracentesis  09/22/2014   INDICATION: ascites  EXAM: ULTRASOUND-GUIDED PARACENTESIS  COMPARISON:  Previous para  MEDICATIONS: 10 cc 1% lidocaine.  COMPLICATIONS: None immediate  TECHNIQUE: Informed written consent was obtained from the patient after a discussion of the risks, benefits and alternatives to treatment. A timeout was performed prior to the initiation of the procedure.  Initial ultrasound scanning  demonstrates a large amount of ascites within the right lower abdominal quadrant. The right lower abdomen was prepped and draped in the usual sterile fashion. 1% lidocaine with epinephrine was used for local anesthesia. Under direct ultrasound guidance, a 19 gauge, 7-cm, Yueh catheter was introduced. An ultrasound image was saved for documentation purposed. The paracentesis was performed. The catheter was removed and a dressing was applied. The patient tolerated the procedure well without immediate post procedural complication.  FINDINGS: A total of  approximately 1.9 liters of yellow fluid was removed.  IMPRESSION: Successful ultrasound-guided paracentesis yielding 1.9 liters of peritoneal fluid.  Read by:  Lavonia Drafts [facility]   Electronically Signed   By: Jerilynn Mages.  Shick M.D.   On: 09/22/2014 16:32   US Paracentesis  09/08/2014   CLINICAL DATA:  44 year old female with a history of appendiceal carcinoma, referred for image guided paracentesis.  EXAM: ULTRASOUND GUIDED  PARACENTESIS  COMPARISON:  Prior CT 06/06/2013  PROCEDURE: An ultrasound guided paracentesis was thoroughly discussed with the patient and questions answered. The benefits, risks, alternatives and complications were also discussed. The patient understands and wishes to proceed with the procedure. Written consent was obtained.  Ultrasound was performed to localize and mark an adequate pocket of fluid in the right lower quadrant of the abdomen. The area was then prepped and draped in the normal sterile fashion. 1% Lidocaine was used for local anesthesia. Under ultrasound guidance a Safe-T-Centesis catheter was introduced. Paracentesis was performed. The catheter was removed and a dressing applied.  COMPLICATIONS: None.  FINDINGS: A total of approximately 6 L of thin yellow fluid was removed. A fluid sample was sent for laboratory analysis.  IMPRESSION: Status post ultrasound-guided paracentesis, with a sample sent to the lab for complete analysis.  Signed,   Dulcy Fanny. Earleen Newport, DO  [facility]  [facility]  PLAN: The patient received some care at [facility] [place] location for [place], with a prior conversation about potentially inserting a PleurX catheter for abdominal drainage. The husband reports that this may be something they would explore hearing at [facility], however for now, they will maintain the periodic ultrasound-guided paracentesis.   Electronically Signed   By: Corrie Mckusick D.O.   On: 09/08/2014 18:20       IMPRESSION: The patient was scheduled to seen me today in clinic to discuss palliative radiation treatment. Patient would be a good candidate for radiation treatment to the large abdominal tumor which is likely contributing to her pain and constipation. I saw this patient in the hospital.    PLAN: We discussed the possible side effects and risks of treatment in addition to the possible benefits of treatment. We discussed the protocol for radiation treatment.  All of the patient's questions were answered.  Patient appears to be interested in proceeding with radiation treatment in the management of her disease.   A tenative simulation appointment will be scheduled for this Friday depending if the patient can get her pain under control. Radiation treatment would begin on that following Tuesday. I discussed that improvement with radiation would not be seen immediately. If the patient requires more intensive management of her constipation/ bowel obstruction, then the radiation schedule can be altered. I would anticipate 3 weeks of radiation treatment which can continue as an outpatient.        ________________________________   Jodelle Gross, MD, PhD   **Disclaimer: This note was dictated with voice recognition software. Similar sounding words can inadvertently be transcribed and this note may contain transcription errors which may not have been corrected upon publication of  note.**   This document serves as a record of services personally performed by Kyung Rudd, MD. It was created on his behalf by Derek Mound, a trained medical scribe. The creation of this record is based on the scribe's personal observations and the provider's statements to them. This document has been checked and approved by the attending provider.

## 2014-09-30 NOTE — Progress Notes (Signed)
Dr. Charlies Silvers said to d/c tele.  Irven Baltimore, RN

## 2014-09-30 NOTE — Consult Note (Addendum)
Consultation Note Date: 09/30/2014   Patient Name: Laura [name]  DOB: 1971-01-14  MRN: [id number]  Age / Sex: 44 y.o., female   PCP: Brien Few, MD Referring Physician: Robbie Lis, MD  Reason for Consultation: Establishing goals of care   Palliative Care Assessment and Plan Summary of Established Goals of Care and Medical Treatment Preferences   Laura [name] is a 44 y.o. female with history of Metastatic Adenocarcinoma admitted with abdominal pain to the hospitalist service since 09-29-14.   Patient was diagnosed with metastatic adenocarcinoma after C section February 2015. She received CAPOX, then Avastin, restaging in August 2015 showed progression, she was referred to cancer treatment centers [facility], restaging in April 2016 showed progression. At her last visit with her oncologist in June 2016, she declined hospice services.   She has chronic pain from her tumor and presented with worsening of her abdominal pain. She has Dilaudid Po and MS contin at home for pain control, she complained of chronic constipation, recent CT shows extensive disease in the belly and also in the chest. She states that she last had chemo in March. She elected to be a FULL CODE at the time of this admission.   A palliative consult has been placed for goals of care discussions and for symptom management.   The patient is resting in bed, she is nauseous, she listens but does not say much, husband at bedside, is tearful and is the primary historian. "I don't want to see Oria lay here and suffer like this, I know that both Dr Ammie Dalton and the people at Cancer treatment center told us about Hospice, but I'm not ready for hospice yet."  Brief life review performed, the baby is 109 months old, they have a 34 year old as well, they have family and friend support. They are both distressed about the patient's ongoing symptoms and her rapid progression. She has been seen by Radiation Oncologist earlier today.    Introduced palliative medicine as a mode of care to control symptoms and increase well being, even in the face of acute and serious illness such as cancer. Discussed scope of Hospice services.   PLAN: 1. Will work on pain management, nausea and constipation management.  2. Follow up family meeting 10-01-14 at 58 AM in the patient's room to further discuss code status and addition of hospice services. See below.   Contacts/Participants in Discussion: Primary Decision Maker:     HCPOA: yes   husband is designated agent.   Code Status/Advance Care Planning:  Full code for now. Extensive discussions undertaken about CPR, ACLS, intubation and mechanical ventilation in the face of progressive cancer. Ongoing discussions, palliative will follow up with patient and husband for another family meeting on 10-01-14 at 19 AM.   Symptom Management:   Abdominal pain: Scheduled and IV PRN Dilaudid. May need short term PCA if does not respond. Balance pain control regimen with bowel regimen.   Constipation: SSE, D/C colace, Add Sennokot, increased dose of Miralax. Continue to monitor. May have to consider Relistor ( opioid induced constipation) in 24 hours, if current regimen not effective.   Nausea: Scheduled and PRN Zofran IV  Palliative Prophylaxis: yes  Additional Recommendations (Limitations, Scope, Preferences):  Aggressive symptom management, continue to build trust with patient and her husband, ongoing discussion about code status and goals of care, including addition of hospice services.  Psycho-social/Spiritual:   Support System: husband, family, friends, church.   Desire for further Chaplaincy support:no  Prognosis:  weeks  Discharge Planning:  to be determined.    Domains of Care: - Physical: abdominal pain, nausea, constipation - Psychological: anxious, tearful.  - Social: 38 year old child, 22 month old child, married, husband at bedside - Spiritual: states she has support  from her church. No acute spiritual issues noted in initial encounter.  - Cultural: husband states they have support from family and friends.  - Imminently dying: no but prognosis appears limited given recent imaging evidence of progression and uncontrolled symptoms.  - Ethical/Legal:  Full code, patient is decisional for now, husband would be her health care representative.   Values: shifting towards establishing comfort as singular goal.  Life limiting illness: metastatic Adenocarcinoma.       Chief Complaint/History of Present Illness: abdominal pain, constipation, acute on chronic abdominal pain.   Primary Diagnoses  Present on Admission:  . Hypertension . 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 Abdominal pain . Inadequate pain control  Palliative Review of Systems: Addressed abdominal pain, nausea, anxiety and constipation.   I have reviewed the medical record, interviewed the patient and family, and examined the patient. The following aspects are pertinent.  Past Medical History  Diagnosis Date  . Medical history non-contributory   . Postpartum care following cesarean delivery (05/20/13) 05/20/2013  . S/P repeat low transverse C-section 05/20/2013  . Cancer 06-05-13    Dx. 1 week ago ? "appendix cancer"   History   Social History  . Marital Status: Married    Spouse Name: N/A  . Number of Children: N/A  . Years of Education: N/A   Social History Main Topics  . Smoking status: Never Smoker   . Smokeless tobacco: Never Used  . Alcohol Use: No  . Drug Use: No  . Sexual Activity: Yes    Birth Control/ Protection: None   Other Topics Concern  . None   Social History Narrative   No family history on file. Scheduled Meds: . amLODipine  10 mg Oral Daily  . docusate sodium  100 mg Oral BID  . folic acid  1 mg Oral Daily  . heparin  5,000 Units Subcutaneous 3 times per day  . multivitamin with minerals  1 tablet Oral Daily  .  predniSONE  20 mg Oral Q breakfast  . sodium chloride  10-40 mL Intracatheter Q12H  . sodium chloride  3 mL Intravenous Q12H  . thiamine  100 mg Oral Daily   Continuous Infusions: . sodium chloride 50 mL/hr at 09/29/14 2045   PRN Meds:.acetaminophen **OR** acetaminophen, HYDROmorphone (DILAUDID) injection, ondansetron **OR** ondansetron (ZOFRAN) IV, oxyCODONE, polyethylene glycol, sodium chloride Medications Prior to Admission:  Prior to Admission medications   Medication Sig Start Date End Date Taking? Authorizing Provider  amLODipine (NORVASC) 10 MG tablet Take 10 mg by mouth daily.   Yes Historical Provider, MD  HYDROmorphone (DILAUDID) 4 MG tablet Take 1-2 tablets (4-8 mg total) by mouth every 3 (three) hours as needed for severe pain. Patient taking differently: Take 4 mg by mouth every 4 (four) hours as needed for severe pain.  09/22/14  Yes Owens Shark, NP  morphine (MS CONTIN) 15 MG 12 hr tablet Take 30 mg by mouth every 12 (twelve) hours.   Yes Historical Provider, MD  ondansetron (ZOFRAN) 4 MG tablet Take 4 mg by mouth every 8 (eight) hours as needed for nausea or vomiting.   Yes Historical Provider, MD  OVER THE COUNTER MEDICATION Take 2 tablets by mouth at  bedtime. Otc. Calms forte   Yes Historical Provider, MD  polyethylene glycol (MIRALAX / GLYCOLAX) packet Take 17 g by mouth daily as needed for moderate constipation.    Yes Historical Provider, MD  predniSONE (DELTASONE) 20 MG tablet Take 1 tablet (20 mg total) by mouth daily with breakfast. 09/15/14  Yes Owens Shark, NP  amLODipine (NORVASC) 5 MG tablet Take 1 tablet (5 mg total) by mouth daily. Patient not taking: Reported on 09/29/2014 11/07/13   Owens Shark, NP  morphine (MS CONTIN) 30 MG 12 hr tablet Take 1 tablet (30 mg total) by mouth every 12 (twelve) hours. Patient not taking: Reported on 09/29/2014 09/22/14   Owens Shark, NP   No Known Allergies CBC:    Component Value Date/Time   WBC 11.9* 09/30/2014 0635    WBC 3.4* 11/03/2013 1442   HGB 12.5 09/30/2014 0635   HGB 12.7 11/03/2013 1442   HCT 40.2 09/30/2014 0635   HCT 38.8 11/03/2013 1442   PLT 239 09/30/2014 0635   PLT 72 Large platelets present* 11/03/2013 1442   MCV 85.4 09/30/2014 0635   MCV 98.2 11/03/2013 1442   NEUTROABS 9.2* 09/29/2014 1531   NEUTROABS 1.9 11/03/2013 1442   LYMPHSABS 1.3 09/29/2014 1531   LYMPHSABS 1.0 11/03/2013 1442   MONOABS 0.8 09/29/2014 1531   MONOABS 0.5 11/03/2013 1442   EOSABS 0.1 09/29/2014 1531   EOSABS 0.1 11/03/2013 1442   BASOSABS 0.0 09/29/2014 1531   BASOSABS 0.0 11/03/2013 1442   Comprehensive Metabolic Panel:    Component Value Date/Time   NA 142 09/30/2014 0635   NA 140 11/03/2013 1442   K 3.5 09/30/2014 0635   K 4.3 11/03/2013 1442   CL 104 09/30/2014 0635   CO2 25 09/30/2014 0635   CO2 27 11/03/2013 1442   BUN 17 09/30/2014 0635   BUN 9.6 11/03/2013 1442   CREATININE 0.51 09/30/2014 0635   CREATININE 0.8 11/03/2013 1442   GLUCOSE 95 09/30/2014 0635   GLUCOSE 84 11/03/2013 1442   CALCIUM 8.6* 09/30/2014 0635   CALCIUM 9.4 11/03/2013 1442   AST 52* 09/30/2014 0635   AST 39* 11/03/2013 1442   ALT 70* 09/30/2014 0635   ALT 41 11/03/2013 1442   ALKPHOS 260* 09/30/2014 0635   ALKPHOS 187* 11/03/2013 1442   BILITOT 1.9* 09/30/2014 0635   BILITOT 0.58 11/03/2013 1442   PROT 6.3* 09/30/2014 0635   PROT 6.6 11/03/2013 1442   ALBUMIN 3.0* 09/30/2014 0635   ALBUMIN 3.4* 11/03/2013 1442    Physical Exam: Vital Signs: BP 155/101 mmHg  Pulse 146  Temp(Src) 97.7 F (36.5 C) (Oral)  Resp 18  Ht 5\' 2"  (1.575 m)  Wt 57.834 kg (127 lb 8 oz)  BMI 23.31 kg/m2  SpO2 100% SpO2: SpO2: 100 % O2 Device: O2 Device: Not Delivered O2 Flow Rate:   Intake/output summary:  Intake/Output Summary (Last 24 hours) at 09/30/14 8882 Last data filed at 09/30/14 0600  Gross per 24 hour  Intake 2582.5 ml  Output      0 ml  Net 2582.5 ml   LBM: Last BM Date: 09/30/14 (watery, loose, small  amount) Baseline Weight: Weight: 57.834 kg (127 lb 8 oz) Most recent weight: Weight: 57.834 kg (127 lb 8 oz)  Exam Findings:  Pale weak nauseous appearing lady Clear S 1 S2 Abdomen mild diffuse tenderness, mild distension No edema Non focal  Palliative Performance Scale: 30% Additional Data Reviewed: Recent Labs     09/29/14  1531  09/30/14  0635  WBC  11.4*  11.9*  HGB  12.9  12.5  PLT  261  239  NA  141  142  BUN  19  17  CREATININE  0.59  0.51     Time In: 0900 Time Out: 0955 Time Total: 55 Greater than 50%  of this time was spent counseling and coordinating care related to the above assessment and plan.  Signed by: Loistine Chance, MD  [phone]  Loistine Chance, MD  09/30/2014, 9:04 AM  Please contact Palliative Medicine Team phone at [phone] for questions and concerns.

## 2014-09-30 NOTE — Progress Notes (Addendum)
Patient ID: Laura [name], female   DOB: 25-Oct-1970, 44 y.o.   MRN: [id number] [facility] PROGRESS NOTE  JUAN OLTHOFF NOM:[id number] DOB: 1970-04-16 DOA: 09/29/2014 PCP: [name], MD  Brief narrative:    44 y.o. 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 She was referred to Dr. Benay Spice of oncology for evaluation and her staging CT scan revealed appendix mass and lymphadenopathy. She then underwent colonoscopy 06/12/2013 and was found to have cecal/ appendiceal mass which was also biopsied and findings confirmed poorly differentiated adenocarcinoma. She started chemotherapy in 07/2013 however restaging CT scans done in [facility] hospital in 11/2013 showed progression of metastases in the peritoneum with multiple enlarged ileocolic lymph nodes in addition to now enlarged appendix and lung nodules throughout the right and left lung. Again chemotherapy continued and yet third staging CT scans in 07/2014 showed progression of pulmonary metastases and interval progression of omental and mesenteric carcinomatosis as well as ascites. CT again done 09/22/2014 with findings of peritoneal carcinomatosis with extensive tumor throughout the peritoneal cavity including a large 16.8 cm diameter mass in the pelvis, associated omental caking, mesenteric adenopathy, and anterior abdominal wall tumor extension; multiple pulmonary metastases and questionable new single metastasis at left lateral liver lobe.  Patient presented to [facility] ED with worsening abdominal pain, nausea, vomiting and poor po intake. She also reported not having bowel movement for over past month prior to this admission. She was hemodynamically stable on admission with  exception of tachycardia thought to be related to uncontrolled pain. Blood work was unremarkable other than mild leukocytosis of 11.4. CT abdomen done on this admission with findings of large volume abdominal pelvic tumor, similar and slightly increased, with mass effect upon the sigmoid colon and increase in more proximal colonic diameter, possible constipation and possibly a component of sigmoid colonic obstruction but no small bowel obstruction; pulmonary metastasis, likely progressive since 09/22/2014 and similar volume of ascites, similar splenic infarcts and suspicion of osseous metastasis.    She was seen by Dr. Lisbeth Renshaw in consultation and plan is to proceed with palliative radiation to abdominal wall tumor likely responsible for her symptoms.   Barrier to discharge: Uncontrolled pain is barrier to discharge. We asked palliative care for goals of care, symptom management. Pt not ready accept hospice at this time. Will continue pain control, N/V control.    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  Constipation  - From abdominal wall tumor - Has had BM since admission - Will get palliative therapy to this area per Dr. Lisbeth Renshaw   Ascites - Due to liver metastases - Will get paracentesis tomorrow   Leukocytosis - Likely from prednisone   Hypertension, essential - Continue Norvasc daily   Tachycardia - Sinus tachycardia seen on 12 lead EKG - Likely from uncontrolled pain  Malnutrition of moderate degree - In the context of chronic illness, malignancy - Continue nutritional supplementation  - Continue prednisone daily    DVT Prophylaxis  - Heparin subQ ordered    Code Status: Full.  Family Communication:  plan of care discussed with the patient and her husband at the bedside  Disposition Plan: Home once pain improves.   IV access:  Port-a-cath - right chest   Procedures and diagnostic studies:    Ct Abdomen Pelvis Wo Contrast 09/29/2014   1. mild degradation secondary to motion and lack of IV contrast. 2. Large volume abdominal pelvic tumor, similar and slightly increased, with mass effect upon the sigmoid colon and increase in more proximal colonic diameter. Findings suggest constipation and possibly a component of colonic obstruction at the level of the sigmoid. No small bowel obstruction. 3. Pulmonary metastasis, likely progressive since 09/22/2014. 4. Similar volume of ascites. 5. Grossly similar splenic infarcts. 6. Suspicion of osseous metastasis.     Medical Consultants:  Palliative care  Other Consultants:  Nutrition  IAnti-Infectives:   None   Laura Lenz, MD  [facility] Pager [phone]  Time spent in minutes: 25 minutes  If 7PM-7AM, please contact night-coverage [email or web address] Password [facility] 09/30/2014, 3:39 PM   LOS: 1 day    HPI/Subjective: No acute overnight events. Patient reports abdominal pain to be 7/10 in intensity.  Objective: Filed Vitals:   09/29/14 1952 09/29/14 2021 09/30/14 0512 09/30/14 1423  BP: 151/97 137/92  155/101 145/95  Pulse: 126 138 146 130  Temp: 98.2 F (36.8 C) 98 F (36.7 C) 97.7 F (36.5 C) 98.3 F (36.8 C)  TempSrc: Oral Oral Oral Oral  Resp: 15 16 18 16   Height:  5\' 2"  (1.575 m)    Weight:  57.834 kg (127 lb 8 oz)    SpO2: 99% 100% 100% 100%    Intake/Output Summary (Last 24 hours) at 09/30/14 1539 Last data filed at 09/30/14 0600  Gross per 24 hour  Intake 2582.5 ml  Output      0 ml  Net 2582.5 ml    Exam:   General:  Pt is alert, follows commands appropriately, not in acute distress  Cardiovascular: Regular rate and rhythm, S1/S2, no murmurs  Respiratory: Clear to auscultation bilaterally, no wheezing, no crackles, no rhonchi  Abdomen: firm to palpation, (+) BS  Extremities: No edema, pulses DP and PT palpable bilaterally  Neuro: Grossly nonfocal  Data Reviewed: Basic Metabolic Panel:  Recent Labs Lab 09/29/14 1531 09/30/14 0635  NA 141 142  K 3.9 3.5  CL 99* 104  CO2 28 25  GLUCOSE 95 95  BUN 19 17  CREATININE 0.59 0.51  CALCIUM 9.3 8.6*   Liver Function Tests:  Recent Labs Lab 09/30/14 0635  AST 52*  ALT 70*  ALKPHOS 260*  BILITOT 1.9*  PROT 6.3*  ALBUMIN 3.0*   No results for input(s): LIPASE, AMYLASE in the last 168 hours. No results for input(s): AMMONIA in the last 168 hours. CBC:  Recent Labs Lab 09/29/14 1531 09/30/14 0635  WBC 11.4* 11.9*  NEUTROABS 9.2*  --  HGB 12.9 12.5  HCT 41.5 40.2  MCV 84.5 85.4  PLT 261 239   Cardiac Enzymes: No results for input(s): CKTOTAL, CKMB, CKMBINDEX, TROPONINI in the last 168 hours. BNP: Invalid input(s): POCBNP CBG:  Recent Labs Lab 09/30/14 0733  GLUCAP 88    No results found for this or any previous visit (from the past 240 hour(s)).   Scheduled Meds: . amLODipine  10 mg Oral Daily  . feeding supplement   237 mL Oral BID BM  . folic acid  1 mg Oral Daily  . heparin  5,000 Units Subcutaneous 3 times per day  .  HYDROmorphone  2 mg Intravenous Q6H WA  .  multivitamin   1 tablet Oral Daily  . ondansetron (ZOFRAN)   4 mg Intravenous 4 times per day  . polyethylene glycol  17 g Oral BID  . predniSONE  20 mg Oral Q breakfast  . senna-docusate  2 tablet Oral BID  . thiamine  100 mg Oral Daily   Continuous Infusions: . sodium chloride 50 mL/hr at 09/29/14 2045

## 2014-09-30 NOTE — Progress Notes (Signed)
Initial Nutrition Assessment  DOCUMENTATION CODES:  Non-severe (moderate) malnutrition in context of chronic illness  INTERVENTION: - Will order Ensure Enlive BID, each supplement provides 350 kcal and 20 grams of protein - Will monitor for need to order [employer] as well/instead - RD will continue to monitor for needs  NUTRITION DIAGNOSIS:  Increased nutrient needs related to cancer and cancer related treatments as evidenced by estimated needs.  GOAL:  Patient will meet greater than or equal to 90% of their needs  MONITOR:  PO intake, Supplement acceptance, Weight trends, Labs, I & O's  REASON FOR ASSESSMENT:  Malnutrition Screening Tool  ASSESSMENT: 44 y.o. female with history of Metastatic Adenocarcinoma of the GI presents with abdominal pain. She has chronic pain from her tumor and presents today with worsening of her abdominal pain. She has been in the ED since about 12PM but has not found relief of the pain. As a result of usage of narcotics for her pain control she also has chronic constipation which has worsened somewhat. She has not had a BM for about a month she states. She had a CT scan done here in the ED and this shows extensive disease in the belly and also in the chest. She states that she last had chemo in March. The cancer center has discussed Palliative Care with her but she is not decided yet about this.   Pt seen for MST. BMI indicates normal weight status. Pt reports that for the past few weeks she has mainly been drinking and not eating much solid food. So far today she has only had liquids (water and ginger ale). Per chart review, pt has not had a BM x1 month.   She reports that weight has been trending down but she is unsure how much weight she may have lost. Per weight hx review, pt has lost 3 lbs (2.3% body weight) in the past 7 days which is significant for time frame. Mild to moderate muscle and fat wasting noted during physical assessment.  Pt is  receptive to receiving Ensure and states that she was sometimes drinking it at home. Not meeting needs. Will monitor for GOC. Medications reviewed. Labs reviewed; Ca: 8.6 mg/dL, LFTs elevated.   Height:  Ht Readings from Last 1 Encounters:  09/29/14 5\' 2"  (1.575 m)    Weight:  Wt Readings from Last 1 Encounters:  09/29/14 127 lb 8 oz (57.834 kg)    Ideal Body Weight:  50 kg (kg)  Wt Readings from Last 10 Encounters:  09/29/14 127 lb 8 oz (57.834 kg)  09/22/14 127 lb 11.2 oz (57.924 kg)  09/15/14 130 lb (58.968 kg)  11/07/13 178 lb 6.4 oz (80.922 kg)  11/03/13 184 lb 9.6 oz (83.734 kg)  10/07/13 184 lb 14.4 oz (83.87 kg)  09/16/13 183 lb 8 oz (83.235 kg)  08/27/13 181 lb 14.4 oz (82.509 kg)  08/05/13 182 lb 1.6 oz (82.6 kg)  07/07/13 186 lb 4.8 oz (84.505 kg)    BMI:  Body mass index is 23.31 kg/(m^2).  Estimated Nutritional Needs:  Kcal:  2000-2200  Protein:  70-80 grams  Fluid:  2.2 L/day  Skin:  Wound (see comment) (stage 2 L buttocks pressure ulcer)  Diet Order:  Diet Heart Room service appropriate?: Yes; Fluid consistency:: Thin  EDUCATION NEEDS:  No education needs identified at this time   Intake/Output Summary (Last 24 hours) at 09/30/14 1248 Last data filed at 09/30/14 0600  Gross per 24 hour  Intake 2582.5 ml  Output      0 ml  Net 2582.5 ml    Last BM:  PTA     Jarome Matin, RD, LDN Inpatient Clinical Dietitian Pager # [phone] After hours/weekend pager # [phone]

## 2014-10-01 ENCOUNTER — Telehealth: Payer: Self-pay | Admitting: *Deleted

## 2014-10-01 DIAGNOSIS — R112 Nausea with vomiting, unspecified: Secondary | ICD-10-CM

## 2014-10-01 DIAGNOSIS — C181 Malignant neoplasm of appendix: Secondary | ICD-10-CM

## 2014-10-01 DIAGNOSIS — G893 Neoplasm related pain (acute) (chronic): Secondary | ICD-10-CM

## 2014-10-01 DIAGNOSIS — I10 Essential (primary) hypertension: Secondary | ICD-10-CM

## 2014-10-01 DIAGNOSIS — K5909 Other constipation: Secondary | ICD-10-CM

## 2014-10-01 DIAGNOSIS — Z7189 Other specified counseling: Secondary | ICD-10-CM | POA: Insufficient documentation

## 2014-10-01 DIAGNOSIS — R000 Tachycardia, unspecified: Secondary | ICD-10-CM

## 2014-10-01 LAB — GLUCOSE, CAPILLARY: Glucose-Capillary: 75 mg/dL (ref 65–99)

## 2014-10-01 MED ORDER — HYDROMORPHONE BOLUS VIA INFUSION
1.0000 mg | INTRAVENOUS | Status: DC | PRN
  Administered 2014-10-01 – 2014-10-02 (×6): 1 mg via INTRAVENOUS
  Filled 2014-10-01 (×7): qty 1

## 2014-10-01 MED ORDER — BISACODYL 10 MG RE SUPP
10.0000 mg | Freq: Every day | RECTAL | Status: DC | PRN
  Filled 2014-10-01: qty 1

## 2014-10-01 MED ORDER — SODIUM CHLORIDE 0.9 % IV SOLN
2.0000 mg/h | INTRAVENOUS | Status: DC
  Administered 2014-10-01: 1 mg/h via INTRAVENOUS
  Administered 2014-10-02 (×2): 2 mg/h via INTRAVENOUS
  Administered 2014-10-02 (×2): 1 mg/h via INTRAVENOUS
  Administered 2014-10-03 (×3): 2 mg/h via INTRAVENOUS
  Filled 2014-10-01 (×3): qty 5

## 2014-10-01 NOTE — Progress Notes (Signed)
Clinical Social Work  CSW received inappropriate referral to assist with home hospice. CSW alerted CM (Cookie) of patient's needs. CSW is signing off but available if needed.  [place], [place] [phone]

## 2014-10-01 NOTE — Progress Notes (Addendum)
Patient ID: Laura [name], female   DOB: 01-18-1971, 44 y.o.   MRN: [id number] [facility] PROGRESS NOTE  JOHNICE RIEBE [name]:[id number] DOB: 05-07-1970 DOA: 09/29/2014 PCP: [name], MD  Oncology: Dr. Betsy Coder  Brief narrative:    44 y.o. 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 She was referred to Dr. Benay Spice of oncology for evaluation and her staging CT scan revealed appendix mass and lymphadenopathy. She then underwent colonoscopy 06/12/2013 and was found to have cecal / appendiceal mass which was also biopsied and findings confirmed poorly differentiated adenocarcinoma. She started chemotherapy in 07/2013 however restaging CT scans done in [facility] hospital in 11/2013 showed progression of metastases in the peritoneum with multiple enlarged ileocolic lymph nodes in addition to now enlarged appendix and lung nodules throughout the right and left lung. Again chemotherapy continued and yet third staging CT scans in 07/2014 showed progression of pulmonary metastases and interval progression of omental and mesenteric carcinomatosis as well as ascites. CT again done 09/22/2014 with findings of peritoneal carcinomatosis with extensive tumor throughout the peritoneal cavity including a large 16.8 cm diameter mass in the pelvis, associated omental caking, mesenteric adenopathy, and anterior abdominal wall tumor extension; multiple pulmonary metastases and questionable new single metastasis at left lateral liver lobe.  Patient presented to [facility] ED with worsening abdominal pain, nausea, vomiting and poor po intake. She also reported not having bowel movement for over past month prior to this admission. She was  hemodynamically stable on admission with exception of tachycardia thought to be related to uncontrolled pain. Blood work was unremarkable other than mild leukocytosis of 11.4. CT abdomen done on this admission with findings of large volume abdominal pelvic tumor, similar and slightly increased, with mass effect upon the sigmoid colon and increase in more proximal colonic diameter, possible constipation and possibly a component of sigmoid colonic obstruction but no small bowel obstruction; pulmonary metastasis, likely progressive since 09/22/2014 and similar volume of ascites, similar splenic infarcts and suspicion of osseous metastasis.    She was seen by Dr. Lisbeth Renshaw in consultation and plan is to proceed with palliative radiation to abdominal wall tumor likely responsible for her symptoms at present.   Barrier to discharge: Pain remains largely uncontrolled at this time. Palliative care was consulted for overall goals of care. Initially, patient was not ready to except hospice. This morning palliative care has met with the patient and her husband at the bedside. Patient understands that she requires aggressive pain management and she is now willing to consider hospice. I anticipate discharge in next day or so (by 10/03/14)  if pain controlled better. She is now on dilaudid drip and may end up going home with this.   Assessment/Plan:    Principal Problem: Abdominal pain, nausea and vomiting - Likely from progression of extensive omental and mesenteric carcinomatosis and abdominal wall tumor - Continue current pain management efforts. She is on Dilaudid drip in addition to Dilaudid boluses if needed. - Appreciate palliative care meeting with the family and discussing goals of care.  - Plan is for patient to go home with hospice once her pain is better controlled. She may need to go home with Dilaudid drip/Dilaudid PCA whatever acceptable by home hospice.   Active Problems: Metastatic adenocarcinoma,  omental and mesenteric carcinomatosis, pulmonary and possible osseous metastases / Abdominal wall tumor - Plan for discharge home with hospice in place  - The plan is still to have patient undergo palliative radiation therapy to abdominal wall tumor.  Patient was seen by Dr. Lisbeth Renshaw of radiation oncology who recommended palliative radiation therapy for total of 3 weeks. Tentative appointment scheduled for simulation this Friday 10/02/2014. - Will continue to provide emotional support to her and her family while they are going through this very difficult time.  Constipation  - From abdominal wall tumor - Has had 2 BM since admission   Ascites - Due to liver metastases. - Same amount of ascites volume seen on admission CT scan as on prior study.  - Since patient is in a lot of pain will defer paracentesis for now.  Leukocytosis - Likely from prednisone  - Pt afebrile  - No obvious source of infection   Hypertension, essential - Continue Norvasc 10 mg daily   Tachycardia - Sinus tachycardia seen on 12 lead EKG on admission  - Likely from uncontrolled pain  Malnutrition of moderate degree - In the context of chronic illness, malignancy - Continue nutritional supplementation  - Continue prednisone daily    DVT Prophylaxis  - SCD's bilaterally while pt in hospital    Code Status: DNR/DNI Family Communication:  plan of care discussed with the patient and her husband at the bedside  Disposition Plan: Home once pain better controlled.   IV access:  Port-a-cath - right chest   Procedures and diagnostic studies:    Ct Abdomen Pelvis Wo Contrast 09/29/2014   1. mild degradation secondary to motion and lack of IV contrast. 2. Large volume abdominal pelvic tumor, similar and slightly increased, with mass effect upon the sigmoid colon and increase in more proximal colonic diameter. Findings suggest constipation and possibly a component of colonic obstruction at the level of the sigmoid. No  small bowel obstruction. 3. Pulmonary metastasis, likely progressive since 09/22/2014. 4. Similar volume of ascites. 5. Grossly similar splenic infarcts. 6. Suspicion of osseous metastasis.     Medical Consultants:  Palliative care  Other Consultants:  Nutrition  IAnti-Infectives:   None   Leisa Lenz, MD  [facility] Pager [phone]  Time spent in minutes: 25 minutes  If 7PM-7AM, please contact night-coverage [email or web address] Password [email or web address] 10/01/2014, 11:33 AM   LOS: 2 days    HPI/Subjective: No acute overnight events. Patient reports abdominal pain 6/10 in intensity.  Objective: Filed Vitals:   09/30/14 1423 09/30/14 2053 10/01/14 0545 10/01/14 0631  BP: 145/95 148/83 148/102 148/94  Pulse: 130 131 139 135  Temp: 98.3 F (36.8 C) 99 F (37.2 C) 97.9 F (36.6 C)   TempSrc: Oral Oral Oral   Resp: $Remo'16 16 18   'tsQyO$ Height:      Weight:      SpO2: 100% 100% 100%    No intake or output data in the 24 hours ending 10/01/14 1133  Exam:   General:  Pt is alert, not in acute distress, malnourished   Cardiovascular: RRR, S1/S2 appreciated   Respiratory: No wheezing, no crackles, no rhonchi  Abdomen: (+) BS, abdomen distended and tender to deep palpation, no rebound   Extremities: No leg swelling, pulses palpable   Neuro: No focal neurologic deficits   Data Reviewed: Basic Metabolic Panel:  Recent Labs Lab 09/29/14 1531 09/30/14 0635  NA 141 142  K 3.9 3.5  CL 99* 104  CO2 28 25  GLUCOSE 95  95  BUN 19 17  CREATININE 0.59 0.51  CALCIUM 9.3 8.6*   Liver Function Tests:  Recent Labs Lab 09/30/14 0635  AST 52*  ALT 70*  ALKPHOS 260*  BILITOT 1.9*  PROT 6.3*  ALBUMIN 3.0*   No results for input(s): LIPASE, AMYLASE in the last 168 hours. No results for input(s): AMMONIA in the last 168 hours. CBC:  Recent Labs Lab 09/29/14 1531 09/30/14 0635  WBC 11.4* 11.9*  NEUTROABS 9.2*  --   HGB 12.9 12.5  HCT 41.5 40.2  MCV 84.5 85.4  PLT 261  239   Cardiac Enzymes: No results for input(s): CKTOTAL, CKMB, CKMBINDEX, TROPONINI in the last 168 hours. BNP: Invalid input(s): POCBNP CBG:  Recent Labs Lab 09/30/14 0733 10/01/14 0715  GLUCAP 88 75    No results found for this or any previous visit (from the past 240 hour(s)).   Scheduled Meds: . amLODipine  10 mg Oral Daily  . feeding supplement   237 mL Oral BID BM  . folic acid  1 mg Oral Daily  . heparin  5,000 Units Subcutaneous 3 times per day  .  HYDROmorphone  2 mg Intravenous Q6H WA  . multivitamin   1 tablet Oral Daily  . ondansetron (ZOFRAN)   4 mg Intravenous 4 times per day  . polyethylene glycol  17 g Oral BID  . predniSONE  20 mg Oral Q breakfast  . senna-docusate  2 tablet Oral BID  . thiamine  100 mg Oral Daily   Continuous Infusions: . sodium chloride 50 mL/hr at 10/01/14 1133  . HYDROmorphone 1 mg/hr (10/01/14 1133)

## 2014-10-01 NOTE — Telephone Encounter (Signed)
Patient called.  She is an in-patient at [facility].  She would like to talk with Dr. Benay Spice.  She has some questions about the "pleur-vac".  Her cell phone is [phone].

## 2014-10-01 NOTE — Progress Notes (Signed)
IP PROGRESS NOTE  Subjective:   She is known to me with a history of metastatic cecal/appendiceal carcinoma. She was in the emergency room yesterday 09/29/2014 with increased abdominal pain and constipation. She reports an enema did not help to constipation. Her pain is currently under better control with IV Dilaudid. She had a small bowel movement.  She is considering enrollment in home Hospice care.  Objective: Vital signs in last 24 hours: Blood pressure 134/77, pulse 130, temperature 98.4 F (36.9 C), temperature source Oral, resp. rate 20, height 5\' 2"  (1.575 m), weight 127 lb 8 oz (57.834 kg), SpO2 94 %, not currently breastfeeding.  Intake/Output from previous day:    Physical Exam:  Lungs: Clear bilaterally Cardiac: Regular rate and rhythm, tachycardia  GI: The abdomen is distended, firm mass in the lower abdomen  Extremities: No leg edema  Portacath/PICC-without erythema  Lab Results:  Recent Labs  09/29/14 1531 09/30/14 0635  WBC 11.4* 11.9*  HGB 12.9 12.5  HCT 41.5 40.2  PLT 261 239    BMET  Recent Labs  09/29/14 1531 09/30/14 0635  NA 141 142  K 3.9 3.5  CL 99* 104  CO2 28 25  GLUCOSE 95 95  BUN 19 17  CREATININE 0.59 0.51  CALCIUM 9.3 8.6*    Studies/Results: Ct Abdomen Pelvis Wo Contrast  09/29/2014   CLINICAL DATA:  Constipation. 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  EXAM: CT ABDOMEN AND PELVIS WITHOUT CONTRAST  TECHNIQUE: Multidetector CT imaging of the abdomen and pelvis was performed following the standard protocol without IV contrast.  COMPARISON:  09/22/2014  FINDINGS: Lower chest: Pulmonary metastasis. Index 1.0 cm nodule on image 6 measured 9 mm on the prior. Normal heart size without pericardial or pleural effusion.  Hepatobiliary: Degraded evaluation of the abdomen secondary to lack of IV contrast and beam hardening artifact from extensive overlying support apparatus. Grossly normal liver, gallbladder, and biliary tract.  Pancreas:  Pancreatic atrophy, without acute inflammation.  Spleen: Similar appearance of multi focal splenic infarcts.  Adrenals/Urinary Tract: Normal adrenal glands. No renal calculi or hydronephrosis. Normal urinary bladder.  Stomach/Bowel: Normal caliber of the stomach. The sigmoid is decompressed, possibly secondary to mass effect by pelvic tumor. The remainder of the colon is stool and gas filled. Large amount of stool throughout the remainder of the colon. In the region of the hepatic flexure, measures 6.5 cm today versus 5.3 cm on 09/22/2014. No small bowel obstruction, pneumatosis, or free intraperitoneal air.  Vascular/Lymphatic: Normal caliber of the aorta and branch vessels. Retroperitoneum not well evaluated for adenopathy. None present on recent contrast-enhanced study. Right external iliac adenopathy at 1.4 cm on image 70, similar.  Reproductive: Uterus not well evaluated.  Other: Pelvic mass or conglomerate masses again identified. On the order of 9.2 x 11.7 cm on image 70 of series 2. Felt to be similar to 09/22/2014 (when remeasured). Slight increase in loculated fluid anterior to the urinary bladder, including on image 74. Widespread omental and peritoneal metastasis throughout the abdomen, better evaluated on recent contrast-enhanced CT. Example right omental abdominal wall mass of 7.6 cm on image 54 versus 7.1 cm on the prior exam (when remeasured).  Left rectus muscle mass is similar at the 3.4 x 7.0 cm.  Musculoskeletal: Vague T10 increased density, suspicious for osseous metastasis. Other areas of heterogeneous marrow density including within the L5 vertebral body are indeterminate but suspicious.  IMPRESSION: 1. mild degradation secondary to motion and lack of IV contrast. 2. Large volume abdominal pelvic  tumor, similar and slightly increased, with mass effect upon the sigmoid colon and increase in more proximal colonic diameter. Findings suggest constipation and possibly a component of colonic  obstruction at the level of the sigmoid. No small bowel obstruction. 3. Pulmonary metastasis, likely progressive since 09/22/2014. 4. Similar volume of ascites. 5. Grossly similar splenic infarcts. 6. Suspicion of osseous metastasis.   Electronically Signed   By: Abigail Miyamoto M.D.   On: 09/29/2014 17:31    Medications: I have reviewed the patient's current medications.  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   CTs of the chest, abdomen, and pelvis on 06/02/2013 revealed an appendix mass and lymphadenopathy in the ileocecal mesentery.   Colonoscopy 06/12/2013 with a cecal/appendiceal orifice mass, status post a biopsy confirming poorly differentiated adenocarcinoma with signet ring cell features.   Initiation of CAPOX 07/15/2013.  Avastin added with cycle 2.  Cycle 5 CAPOX/Avastin 10/07/2013.  Restaging CT evaluation (done at [facility] 11/07/2013 showed increase in the metastatic implants in the peritoneum in the abdomen and pelvis. Multiple enlarged ileocolic lymph nodes. The appendix was again seen to be enlarged filled with soft tissue and measuring up to 2.4 cm. Multiple nodules up to 5 mm throughout the right and left lung not present on the previous study.  Oncology care subsequently transferred to [facility] in [place], then FOLFIRI/cyramza; restaging CT evaluation 07/21/2014 with progression of pulmonary metastasis; interval progression of omental and mesenteric carcinomatosis with interval development of ascites. Office note from [place] on 08/24/2014 indicates she was seen by surgery and not a candidate for CRS/HIPEC; Palliative stivarga discussed.  CT abdomen/pelvis 09/22/2014 showed peritoneal carcinomatosis with extensive tumor throughout the  peritoneal cavity including a large 16.8 cm diameter mass in the pelvis. Associated omental caking, mesenteric adenopathy and anterior abdominal wall tumor extension. Multiple pulmonary metastases and new small left pleural effusion. Question single metastasis at the lateral segment left lower lobe, new. Large area of low attenuation within the anterior inferior spleen. Bladder wall invasion by tumor at the superior margin of the urinary bladder could not be excluded.  CT 09/29/2014 with persistent large volume of abdominal and pelvic tumor with mass effect upon the sigmoid colon. Stable ascites.  2. Cesarean section delivery of her second child 05/20/2013  3. Status post spleen embolization 04/29/2014 4.  Pain secondary to carcinomatosis with large abdomen and pelvic tumor masses  5.  Constipation secondary to narcotic analgesics carcinomatosis, and potentially a colonic obstruction by tumor   Ms. Latu has advanced metastatic carcinoma arising from the appendix/cecum. She has a poor performance status and is not a candidate for further systemic therapy. The goal is comfort care. She could potentially benefit from radiation to the large abdomen/pelvic masses if her pain cannot be relieved with narcotics and the bowels do not function with a laxitive  and stool softener regimen. I doubt she would get significant relief of her symptoms with a paracentesis.    recommendations: 1. Continue Dilaudid drip/bolus 2. Aggressive oral laxiative and stool softener regimen 3. [facility] referral for home care 4. Please call Oncology as needed. I will be glad to follow her as an outpatient with the [facility] program.   LOS: 2 days   Betsy Coder  10/01/2014, 3:49 PM

## 2014-10-01 NOTE — Progress Notes (Signed)
Pt selected [facility], referral called to [phone], fax [phone].

## 2014-10-01 NOTE — Progress Notes (Signed)
Daily Progress Note   Patient Name: Laura [name]       Date: 10/01/2014 DOB: 29-Jun-1970  Age: 44 y.o. MRN#: [id number] Attending Physician: Robbie Lis, MD Primary Care Physician: [name], MD Admit Date: 09/29/2014  Reason for Consultation/Follow-up: Establishing goals of care   Laura [name] is a 44 y.o. female with history of Metastatic Adenocarcinoma admitted with abdominal pain to the hospitalist service since 09-29-14.   Patient was diagnosed with metastatic adenocarcinoma after C section February 2015. She received CAPOX, then Avastin, restaging in August 2015 showed progression, she was referred to cancer treatment centers [facility], restaging in April 2016 showed progression. At her last visit with her oncologist in June 2016, she declined hospice services.   She has chronic pain from her tumor and presented with worsening of her abdominal pain. She has Dilaudid Po and MS contin at home for pain control, she complained of chronic constipation, recent CT shows extensive disease in the belly and also in the chest. She states that she last had chemo in March. She elected to be a FULL CODE at the time of this admission.   A palliative consult was placed for goals of care discussions and for symptom management on 09-30-14.   [place] ON 10-01-14:  Met with the patient, her husband, her parents and her sister in the room. The patient is resting in bed, she has received several dosages of IV Dilaudid in the past 24 hours, both scheduled and PRN, her pain is some what better controlled than it has been in the past. The patient has had a bowel movement as well, she has some nausea.   Patient and her husband produced 2 documents for my review, living will indicating desire for natural death with the exception of her choosing to receive artificial hydration, also reviewed HCPOA document designating husband Tasnim Balentine as her health care power of attorney.   Discussed patient's low  functional status, ongoing symptoms and imaging evidence of progression.   OUTCOME: The following decisions were made: 1. DNR DNI 2. SW consult for Hospice consult, possibly with hospice of [facility], the patient and her husband would like to meet with the hospice liaison and with the social worker in person for additional information.  3. The patient wishes to undergo palliative radiation for the next 2 weeks after her current hospitalization and then enroll in full hospice services.  4. Aggressive symptom management to continue: Dilaudid PCA for 24 hours, then change to appropriate equianalgesic doses of MS Contin+PO Oxycodone for her out patient regimen. Continue bowel regimen and anti emetic regimen.   Subjective: Awake alert, resting in bed, appears to be feeling some better. She was able to fully participate in the family meeting, she made most of the decisions herself with support from her spouse and family present in the room.    Interval Events: Pain is some what better controlled, she has had a bowel movement. She is asking whether she will need paracentesis. Not able to eat, she is tolerating some clear liquids.   Length of Stay: 2 days  Current Medications: Scheduled Meds:  . amLODipine  10 mg Oral Daily  . feeding supplement (ENSURE ENLIVE)  237 mL Oral BID BM  . heparin  5,000 Units Subcutaneous 3 times per day  . ondansetron (ZOFRAN) IV  4 mg Intravenous 4 times per day  . polyethylene glycol  17 g Oral BID  . predniSONE  20 mg Oral Q breakfast  .  senna-docusate  2 tablet Oral BID  . sodium chloride  10-40 mL Intracatheter Q12H  . sodium chloride  3 mL Intravenous Q12H    Continuous Infusions: . sodium chloride 50 mL/hr at 09/30/14 1622  . HYDROmorphone      PRN Meds: acetaminophen **OR** acetaminophen, bisacodyl, HYDROmorphone, ondansetron **OR** ondansetron (ZOFRAN) IV, sodium chloride  Palliative Performance Scale: 30%     Vital Signs: BP 148/94 mmHg   Pulse 135  Temp(Src) 97.9 F (36.6 C) (Oral)  Resp 18  Ht $R'5\' 2"'YL$  (1.575 m)  Wt 57.834 kg (127 lb 8 oz)  BMI 23.31 kg/m2  SpO2 100% SpO2: SpO2: 100 % O2 Device: O2 Device: Not Delivered O2 Flow Rate:    Intake/output summary: No intake or output data in the 24 hours ending 10/01/14 1045 LBM:   Baseline Weight: Weight: 57.834 kg (127 lb 8 oz) Most recent weight: Weight: 57.834 kg (127 lb 8 oz)  Physical Exam: Pale weak appearing, in no distress Awake alert Clear S1 S2 Abdomen mild distension, pain Non focal No edema  Additional Data Reviewed: Recent Labs     09/29/14  1531  09/30/14  0635  WBC  11.4*  11.9*  HGB  12.9  12.5  PLT  261  239  NA  141  142  BUN  19  17  CREATININE  0.59  0.51     Problem List:  Patient Active Problem List   Diagnosis Date Noted  . Pressure ulcer 09/30/2014  . Malnutrition of moderate degree 09/30/2014  . Nausea with vomiting   . Encounter for palliative care   . Abdominal pain 09/29/2014  . Tachycardia 09/29/2014  . Inadequate pain control 09/29/2014  . Hypertension 11/03/2013  . Transaminitis 11/03/2013  . Hyperphosphatemia 11/03/2013  . Hypoalbuminemia due to protein-calorie malnutrition 11/03/2013  . Thrombocytopenia, unspecified 11/03/2013  . 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 Nonspecific (abnormal) findings on radiological and other examination of gastrointestinal tract 06/12/2013  . Metastatic adenocarcinoma involving soft tissue with unknown primary site 05/28/2013  . Postpartum care following cesarean delivery (05/20/13) 05/20/2013  . S/P repeat low transverse C-section 05/20/2013  . Suspected fetal chromosome anomaly affecting antepartum care of mother 12/24/2012     Palliative Care Assessment & Plan    Code Status:  DNR  Goals of Care:   hospice consult, patient wishes to undergo palliative radiation first. Wants to go home, is accepting of hospice. Is now DNR DNI.   Desire for further Chaplaincy  support:no  3. Symptom Management:   Dilaudid PCA today for 24 hours to arrive at appropriate dosages for PO regimen at D/C  4. Palliative Prophylaxis:  Stool Softener: yes  5. Prognosis: weeks to months  5. Discharge Planning: Home with Hospice   Care plan was discussed with: patient, husband, parents, sister, social worker Cookie.  Briefly notified Dr. Charlies Silvers.   Thank you for allowing the Palliative Medicine Team to assist in the care of this patient.   Time In: 0955 Time Out: 1058 Total Time 62 min Prolonged Time Billed  yes     Greater than 50%  of this time was spent counseling and coordinating care related to the above assessment and plan.   Loistine Chance, MD  10/01/2014, 10:45 AM  [phone]  Please contact Palliative Medicine Team phone at [phone] for questions and concerns.

## 2014-10-02 ENCOUNTER — Telehealth: Payer: Self-pay | Admitting: *Deleted

## 2014-10-02 ENCOUNTER — Ambulatory Visit: Payer: BLUE CROSS/BLUE SHIELD | Admitting: Radiation Oncology

## 2014-10-02 ENCOUNTER — Inpatient Hospital Stay (HOSPITAL_COMMUNITY): Payer: BLUE CROSS/BLUE SHIELD

## 2014-10-02 ENCOUNTER — Ambulatory Visit: Admit: 2014-10-02 | Payer: BLUE CROSS/BLUE SHIELD | Admitting: Radiation Oncology

## 2014-10-02 DIAGNOSIS — C7989 Secondary malignant neoplasm of other specified sites: Secondary | ICD-10-CM

## 2014-10-02 DIAGNOSIS — R109 Unspecified abdominal pain: Secondary | ICD-10-CM

## 2014-10-02 DIAGNOSIS — C801 Malignant (primary) neoplasm, unspecified: Secondary | ICD-10-CM

## 2014-10-02 DIAGNOSIS — E440 Moderate protein-calorie malnutrition: Secondary | ICD-10-CM

## 2014-10-02 LAB — GLUCOSE, CAPILLARY: GLUCOSE-CAPILLARY: 84 mg/dL (ref 65–99)

## 2014-10-02 MED ORDER — LACTULOSE 10 GM/15ML PO SOLN
20.0000 g | Freq: Two times a day (BID) | ORAL | Status: DC
  Administered 2014-10-02 (×2): 20 g via ORAL
  Filled 2014-10-02 (×4): qty 30

## 2014-10-02 MED ORDER — ACETAMINOPHEN 325 MG PO TABS: 650.0000 mg | ORAL_TABLET | Freq: Four times a day (QID) | ORAL | Status: AC | PRN

## 2014-10-02 MED ORDER — SENNOSIDES-DOCUSATE SODIUM 8.6-50 MG PO TABS: 2.0000 | ORAL_TABLET | Freq: Two times a day (BID) | ORAL | Status: AC

## 2014-10-02 MED ORDER — ONDANSETRON HCL 4 MG PO TABS: 4.0000 mg | ORAL_TABLET | Freq: Four times a day (QID) | ORAL | Status: DC | PRN

## 2014-10-02 MED ORDER — BISACODYL 10 MG RE SUPP: 10.0000 mg | Freq: Every day | RECTAL | Status: AC | PRN

## 2014-10-02 MED ORDER — HYDROMORPHONE BOLUS VIA INFUSION
2.0000 mg | INTRAVENOUS | Status: DC | PRN
  Administered 2014-10-02 – 2014-10-03 (×3): 2 mg via INTRAVENOUS
  Filled 2014-10-02 (×4): qty 2

## 2014-10-02 MED ORDER — SODIUM CHLORIDE 0.9 % IV SOLN: 1.0000 mg/h | INTRAVENOUS | Status: AC

## 2014-10-02 MED ORDER — ONDANSETRON HCL 4 MG/2ML IJ SOLN
4.0000 mg | INTRAMUSCULAR | Status: DC | PRN
Start: 2014-10-02 — End: 2014-10-03
  Administered 2014-10-02 – 2014-10-03 (×5): 4 mg via INTRAVENOUS
  Filled 2014-10-02 (×5): qty 2

## 2014-10-02 MED ORDER — LACTULOSE 10 GM/15ML PO SOLN: 20.0000 g | Freq: Two times a day (BID) | ORAL | Status: AC

## 2014-10-02 MED ORDER — ENSURE ENLIVE PO LIQD: 237.0000 mL | Freq: Two times a day (BID) | ORAL | Status: AC

## 2014-10-02 MED ORDER — MAGNESIUM CITRATE PO SOLN
1.0000 | Freq: Once | ORAL | Status: AC
  Administered 2014-10-02: 1 via ORAL

## 2014-10-02 NOTE — Telephone Encounter (Signed)
Georjean: [facility] called inquiring if MD Julien Nordmann would be the attending physician for patient. Message sent to [facility].

## 2014-10-02 NOTE — Progress Notes (Signed)
Daily Progress Note   Patient Name: Laura [name]       Date: 10/02/2014 DOB: Jan 20, 1971  Age: 44 y.o. MRN#: [id number] Attending Physician: Annita Brod, MD Primary Care Physician: [name], MD Admit Date: 09/29/2014  Reason for Consultation/Follow-up: Establishing goals of care and pain management adjustment.   Laura [name] is a 44 y.o. female with history of Metastatic Adenocarcinoma admitted with abdominal pain to the hospitalist service since 09-29-14.   Patient was diagnosed with metastatic adenocarcinoma after C section February 2015. She received CAPOX, then Avastin, restaging in August 2015 showed progression, she was referred to cancer treatment centers [facility], restaging in April 2016 showed progression. At her last visit with her oncologist in June 2016, she declined hospice services.   She has chronic pain from her tumor and presented with worsening of her abdominal pain. She has Dilaudid Po and MS contin at home for pain control, she complained of chronic constipation, recent CT shows extensive disease in the belly and also in the chest. She states that she last had chemo in March. She elected to be a FULL CODE at the time of this admission.   A palliative consult was placed for goals of care discussions and for symptom management on 09-30-14.   Otho ON 10-01-14:  Met with the patient, her husband, her parents and her sister in the room. The patient is resting in bed, she has received several dosages of IV Dilaudid in the past 24 hours, both scheduled and PRN, her pain is some what better controlled than it has been in the past. The patient has had a bowel movement as well, she has some nausea.   Patient and her husband produced 2 documents for my review, living will indicating desire for natural death with the exception of her choosing to receive artificial hydration, also reviewed HCPOA document designating husband Amana Bouska as her health care power of  attorney.   Discussed patient's low functional status, ongoing symptoms and imaging evidence of progression.   OUTCOME: The following decisions were made: 1. DNR DNI 2. SW consult for Hospice consult, possibly with hospice of [facility], the patient and her husband would like to meet with the hospice liaison and with the social worker in person for additional information.  3. The patient wishes to undergo palliative radiation for the next 2 weeks after her current hospitalization and then enroll in full hospice services.  4. Aggressive symptom management to continue: Dilaudid PCA for 24 hours, then change to appropriate equianalgesic doses of MS Contin+PO Oxycodone for her out patient regimen. Continue bowel regimen and anti emetic regimen.   10-02-14: Discussed with and appreciate Dr Gearldine Shown input.  Continue to adjust Dilaudid PCA, patient with increased upper abdominal pain currently. Husband states he would like for the patient's pain medication to be titrated while she is still in the hospital.  Agree with Lactulose.  Sun assistance, plan is for home with hospice possibly in 24-48 hours.    Subjective/Interval Events:: Patient is in pain, she states she is hurting in upper abdominal area. Husband is at bedside, patient's father is outside the room.  Husband is apprehensive about the patient's pain medication, upcoming D/C, patient's overall decline. All questions answered, provided supportive care and active listening. Validated his concerns.     Length of Stay: 3 days  Current Medications: Scheduled Meds:  . amLODipine  10 mg Oral Daily  . feeding supplement (ENSURE ENLIVE)  237  mL Oral BID BM  . lactulose  20 g Oral BID  . polyethylene glycol  17 g Oral BID  . predniSONE  20 mg Oral Q breakfast  . senna-docusate  2 tablet Oral BID  . sodium chloride  10-40 mL Intracatheter Q12H  . sodium chloride  3 mL Intravenous Q12H    Continuous  Infusions: . sodium chloride 10 mL/hr at 10/02/14 0859  . HYDROmorphone 1 mg/hr (10/02/14 1329)    PRN Meds: acetaminophen **OR** acetaminophen, bisacodyl, HYDROmorphone, ondansetron **OR** ondansetron (ZOFRAN) IV, sodium chloride  Palliative Performance Scale: 30%     Vital Signs: BP 140/92 mmHg  Pulse 123  Temp(Src) 97.9 F (36.6 C) (Oral)  Resp 16  Ht $R'5\' 2"'Id$  (1.575 m)  Wt 57.834 kg (127 lb 8 oz)  BMI 23.31 kg/m2  SpO2 97% SpO2: SpO2: 97 % O2 Device: O2 Device: Not Delivered O2 Flow Rate:    Intake/output summary:   Intake/Output Summary (Last 24 hours) at 10/02/14 1336 Last data filed at 10/02/14 0429  Gross per 24 hour  Intake 1129.07 ml  Output      0 ml  Net 1129.07 ml   LBM:   Baseline Weight: Weight: 57.834 kg (127 lb 8 oz) Most recent weight: Weight: 57.834 kg (127 lb 8 oz)  Physical Exam: Pale weak appearing, in mild distress due to abdominal pain Awake alert Clear S1 S2 Abdomen mild distension, pain Non focal No edema  Additional Data Reviewed: Recent Labs     09/29/14  1531  09/30/14  0635  WBC  11.4*  11.9*  HGB  12.9  12.5  PLT  261  239  NA  141  142  BUN  19  17  CREATININE  0.59  0.51     Problem List:  Patient Active Problem List   Diagnosis Date Noted  . Goals of care, counseling/discussion   . Pressure ulcer 09/30/2014  . Malnutrition of moderate degree 09/30/2014  . Nausea with vomiting   . Encounter for palliative care   . Abdominal pain 09/29/2014  . Tachycardia 09/29/2014  . Inadequate pain control 09/29/2014  . Hypertension 11/03/2013  . Transaminitis 11/03/2013  . Hyperphosphatemia 11/03/2013  . Hypoalbuminemia due to protein-calorie malnutrition 11/03/2013  . Thrombocytopenia, unspecified 11/03/2013  . 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 Nonspecific (abnormal) findings on radiological and other examination of gastrointestinal tract 06/12/2013  . Metastatic adenocarcinoma involving soft tissue with unknown primary  site 05/28/2013  . Postpartum care following cesarean delivery (05/20/13) 05/20/2013  . S/P repeat low transverse C-section 05/20/2013  . Suspected fetal chromosome anomaly affecting antepartum care of mother 12/24/2012     Palliative Care Assessment & Plan    Code Status:  DNR  Goals of Care:   arrangements are underway for home with hospice on Dilaudid PCA.   3. Symptom Management:   Dilaudid PCA to be increased on basal rate and monitor. Discussed with RN  4. Palliative Prophylaxis:  Stool Softener: yes  5. Prognosis: days-weeks  5. Discharge Planning: Home with Hospice   Care plan was discussed with: patient, husband, RN Thank you for allowing the Palliative Medicine Team to assist in the care of this patient.   Time In: 1320 Time Out: 1345 Total Time 25 Prolonged Time Billed no    Greater than 50%  of this time was spent counseling and coordinating care related to the above assessment and plan.   Loistine Chance, MD  10/02/2014, 1:36 PM  [phone]  Please  contact Palliative Medicine Team phone at [phone] for questions and concerns.

## 2014-10-02 NOTE — Telephone Encounter (Signed)
Called and spoke with Inpatient Nurse to inform her that [name] is scheduled for simulation today to her abdomen at 0930.  Her nurse will check to ensure [name] is aware of her procedure in radiation oncology today.. Laura [name] has been informed.   [name] is also scheduled for a U/S paracentesis this afternoon.

## 2014-10-02 NOTE — Progress Notes (Signed)
Wasted 15 ml of Dilaudid in sink.  Verified with Erin Sons, RN.

## 2014-10-02 NOTE — Progress Notes (Signed)
Pt plan to dc home with [facility] ( on call RNBaxter Flattery RN [phone]). Please call when ready for discharge.

## 2014-10-02 NOTE — Telephone Encounter (Signed)
Not My Patient. She is Dr. Gearldine Shown patient.

## 2014-10-02 NOTE — Progress Notes (Signed)
IP PROGRESS NOTE  Subjective:   She reports good pain control with the IV Dilaudid. She would like to have a therapeutic paracentesis today.  [name] is agreeable to hospice care if she can continue the Dilaudid drip and receive as needed paracentesis procedures.  No bowel movement.  Objective: Vital signs in last 24 hours: Blood pressure 140/92, pulse 123, temperature 97.9 F (36.6 C), temperature source Oral, resp. rate 16, height 5\' 2"  (1.575 m), weight 127 lb 8 oz (57.834 kg), SpO2 97 %, not currently breastfeeding.  Intake/Output from previous day: 06/30 0701 - 07/01 0700 In: 1129.1 [I.V.:1129.1] Out: -   Physical Exam:  Lungs: Clear bilaterally Cardiac: Regular rate and rhythm, tachycardia  GI: The abdomen is distended, firm mass in the lower abdomen  Extremities: No leg edema  Portacath/PICC-without erythema  Lab Results:  Recent Labs  09/29/14 1531 09/30/14 0635  WBC 11.4* 11.9*  HGB 12.9 12.5  HCT 41.5 40.2  PLT 261 239    BMET  Recent Labs  09/29/14 1531 09/30/14 0635  NA 141 142  K 3.9 3.5  CL 99* 104  CO2 28 25  GLUCOSE 95 95  BUN 19 17  CREATININE 0.59 0.51  CALCIUM 9.3 8.6*    Studies/Results: No results found.  Medications: I have reviewed the patient's current medications.  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   CTs of the chest, abdomen, and pelvis on 06/02/2013 revealed an appendix mass and lymphadenopathy in the ileocecal mesentery.   Colonoscopy 06/12/2013 with a cecal/appendiceal orifice mass, status post a biopsy confirming poorly differentiated adenocarcinoma with signet ring cell features.   Initiation of CAPOX 07/15/2013.  Avastin added with cycle 2.  Cycle 5 CAPOX/Avastin 10/07/2013.  Restaging CT evaluation (done at [facility] 11/07/2013 showed increase in  the metastatic implants in the peritoneum in the abdomen and pelvis. Multiple enlarged ileocolic lymph nodes. The appendix was again seen to be enlarged filled with soft tissue and measuring up to 2.4 cm. Multiple nodules up to 5 mm throughout the right and left lung not present on the previous study.  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 Office note from [facility] on 08/24/2014 indicates she was seen by surgery and not a candidate for CRS/HIPEC; Palliative stivarga discussed.  CT abdomen/pelvis 09/22/2014 showed peritoneal carcinomatosis with extensive tumor throughout the peritoneal cavity including a large 16.8 cm diameter mass in the pelvis. Associated omental caking, mesenteric adenopathy and anterior abdominal wall tumor extension. Multiple pulmonary metastases and new small left pleural effusion. Question single metastasis at the lateral segment left lower lobe, new. Large area of low attenuation within the anterior inferior spleen. Bladder wall invasion by tumor at the superior margin of the urinary bladder could not be excluded.  CT 09/29/2014 with persistent large volume of abdominal and pelvic tumor with mass effect upon the sigmoid colon. Stable ascites.  2. Cesarean section delivery of her second child 05/20/2013  3. Status post spleen embolization 04/29/2014 4.  Pain secondary to carcinomatosis with large abdomen and pelvic tumor masses  5.  Constipation secondary to narcotic analgesics carcinomatosis, and potentially a colonic obstruction by tumor  6.  Ascites secondary to #1  [name] appears comfortable  on the Dilaudid drip. She remains constipated. She would like to have a therapeutic  paracentesis today.  [name] and her husband are agreeable to home Hospice care if she can continue the narcotic drip and receive paracentesis procedures if needed.   recommendations: 1. Continue Dilaudid drip/bolus 2. Add lactulose to the bowel regimen 3. [facility] referral for home care 4. Please call Oncology as needed over the weekend. I will follow her at home with the [facility] program. We will schedule an outpatient appointment. 5. Decrease IV fluids   LOS: 3 days   Camil Wilhelmsen  10/02/2014, 8:30 AM

## 2014-10-02 NOTE — Progress Notes (Signed)
PROGRESS NOTE  CANDI PROFIT VQM:[id number] DOB: [birth year] DOA: 09/29/2014 PCP: [name], MD  HPI/Recap of past 24 hours: 44 y.o. 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 She was referred to Dr. Benay Spice of oncology for evaluation and her staging CT scan revealed appendix mass and lymphadenopathy. She then underwent colonoscopy 06/12/2013 and was found to have cecal/ appendiceal mass which was also biopsied and findings confirmed poorly differentiated adenocarcinoma. She started chemotherapy in 07/2013 however restaging CT scans done in [facility] in 11/2013 showed progression of metastases in the peritoneum with multiple enlarged ileocolic lymph nodes in addition to now enlarged appendix and lung nodules throughout the right and left lung. Again chemotherapy continued and yet third staging CT scans in 07/2014 showed progression of pulmonary metastases and interval progression of omental and mesenteric carcinomatosis as well as ascites. CT again done 09/22/2014 with findings of peritoneal carcinomatosis with extensive tumor throughout the peritoneal cavity including a large 16.8 cm diameter mass in the pelvis, associated omental caking, mesenteric adenopathy, and anterior abdominal wall tumor extension; multiple pulmonary metastases and questionable new single metastasis at left lateral liver lobe.  Patient presented to [facility] ED with worsening abdominal pain, nausea, vomiting and poor po intake. She also reported not having bowel movement for over past month prior to this admission. She was hemodynamically stable on admission with exception of tachycardia thought to be related to uncontrolled pain. Blood work was  unremarkable other than mild leukocytosis of 11.4. CT abdomen done on this admission with findings of large volume abdominal pelvic tumor, similar and slightly increased, with mass effect upon the sigmoid colon and increase in more proximal colonic diameter, possible constipation and possibly a component of sigmoid colonic obstruction but no small bowel obstruction; pulmonary metastasis, likely progressive since 09/22/2014 and similar volume of ascites, similar splenic infarcts and suspicion of osseous metastasis.  She was seen by Dr. Lisbeth Renshaw in consultation and plan is to proceed with palliative radiation to abdominal wall tumor likely responsible for her symptoms.   Assessment/Plan: 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 with secondary ascites: Due to liver metastases. Status post paracentesis therapeutic today.   Hypertension   Abdominal pain: Working on controlling pain. On Dilaudid drip. Working with home health for patient to go home on continuous Dilaudid drip. Pain still not fully controlled.   Tachycardia   Inadequate pain control   Nausea with vomiting    Malnutrition of moderate degree:- In the context of chronic illness, malignancy - Continue nutritional supplementation , Continue prednisone daily    Goals of care, counseling/discussion Persistent constipation: We'll try mag citrate  Code Status: DNR  Family Communication: Husband the bedside  Disposition Plan: Home tomorrow if pain can be well controlled and if Dilaudid drip can be set up   Consultants:  Palliative care  Nutrition  Procedures:  Status post therapeutic paracentesis done 7/1  Antibiotics:  None   Objective: BP 146/94 mmHg  Pulse 120  Temp(Src) 97.8 F (36.6 C) (Oral)  Resp 18  Ht 5\' 2"  (1.575 m)  Wt 57.834 kg (127 lb 8 oz)  BMI 23.31 kg/m2  SpO2 98%  Intake/Output Summary (Last 24 hours) at 10/02/14 1720 Last data filed at 10/02/14  3419  Gross per 24 hour  Intake 724.17 ml  Output      0 ml  Net 724.17 ml   Filed Weights   09/29/14 2021  Weight: 57.834 kg (127 lb 8 oz)    Exam:   General:  A&O x 3, mild distress from pain  Cardiovascular: Reg rhythm, mild tachycardia  Respiratory: CTA Bilaterally  Abdomen: Soft, NT, ND, +BS  Musculoskeletal: No clubbing, cyanosis, edema   Data Reviewed: Basic Metabolic Panel:  Recent Labs Lab 09/29/14 1531 09/30/14 0635  NA 141 142  K 3.9 3.5  CL 99* 104  CO2 28 25  GLUCOSE 95 95  BUN 19 17  CREATININE 0.59 0.51  CALCIUM 9.3 8.6*   Liver Function Tests:  Recent Labs Lab 09/30/14 0635  AST 52*  ALT 70*  ALKPHOS 260*  BILITOT 1.9*  PROT 6.3*  ALBUMIN 3.0*   No results for input(s): LIPASE, AMYLASE in the last 168 hours. No results for input(s): AMMONIA in the last 168 hours. CBC:  Recent Labs Lab 09/29/14 1531 09/30/14 0635  WBC 11.4* 11.9*  NEUTROABS 9.2*  --   HGB 12.9 12.5  HCT 41.5 40.2  MCV 84.5 85.4  PLT 261 239   Cardiac Enzymes:   No results for input(s): CKTOTAL, CKMB, CKMBINDEX, TROPONINI in the last 168 hours. BNP (last 3 results) No results for input(s): BNP in the last 8760 hours.  ProBNP (last 3 results) No results for input(s): PROBNP in the last 8760 hours.  CBG:  Recent Labs Lab 09/30/14 0733 10/01/14 0715 10/02/14 0805  GLUCAP 88 75 84    No results found for this or any previous visit (from the past 240 hour(s)).   Studies: No results found.  Scheduled Meds: . amLODipine  10 mg Oral Daily  . feeding supplement (ENSURE ENLIVE)  237 mL Oral BID BM  . lactulose  20 g Oral BID  . magnesium citrate  1 Bottle Oral Once  . polyethylene glycol  17 g Oral BID  . predniSONE  20 mg Oral Q breakfast  . senna-docusate  2 tablet Oral BID  . sodium chloride  10-40 mL Intracatheter Q12H  . sodium chloride  3 mL Intravenous Q12H    Continuous Infusions: . sodium chloride 10 mL/hr at 10/02/14 0859  .  HYDROmorphone 2 mg/hr (10/02/14 1407)     Time spent: 15 minutes  [facility] Pager [phone]. If 7PM-7AM, please contact night-coverage at [email or web address], password [facility] 10/02/2014, 5:20 PM  LOS: 3 days

## 2014-10-03 DIAGNOSIS — R1084 Generalized abdominal pain: Secondary | ICD-10-CM

## 2014-10-03 DIAGNOSIS — F411 Generalized anxiety disorder: Secondary | ICD-10-CM | POA: Insufficient documentation

## 2014-10-03 DIAGNOSIS — R52 Pain, unspecified: Secondary | ICD-10-CM

## 2014-10-03 LAB — GLUCOSE, CAPILLARY: Glucose-Capillary: 109 mg/dL — ABNORMAL HIGH (ref 65–99)

## 2014-10-03 MED ORDER — METOPROLOL TARTRATE 12.5 MG HALF TABLET
12.5000 mg | ORAL_TABLET | Freq: Two times a day (BID) | ORAL | Status: DC
  Administered 2014-10-03: 12.5 mg via ORAL
  Filled 2014-10-03 (×2): qty 1

## 2014-10-03 MED ORDER — METOPROLOL TARTRATE 1 MG/ML IV SOLN
2.5000 mg | INTRAVENOUS | Status: AC
  Administered 2014-10-03: 2.5 mg via INTRAVENOUS
  Filled 2014-10-03: qty 5

## 2014-10-03 MED ORDER — LORAZEPAM 2 MG/ML IJ SOLN: 1.0000 mg | INTRAMUSCULAR | Status: AC | PRN

## 2014-10-03 MED ORDER — HEPARIN SOD (PORK) LOCK FLUSH 100 UNIT/ML IV SOLN: 500.0000 [IU] | INTRAVENOUS | Status: DC | PRN

## 2014-10-03 MED ORDER — METHYLNALTREXONE BROMIDE 12 MG/0.6ML ~~LOC~~ SOLN: 12.0000 mg | Freq: Once | SUBCUTANEOUS | Status: AC

## 2014-10-03 MED ORDER — LORAZEPAM 2 MG/ML IJ SOLN
1.0000 mg | INTRAMUSCULAR | Status: DC | PRN
  Administered 2014-10-03: 1 mg via INTRAVENOUS
  Filled 2014-10-03: qty 1

## 2014-10-03 MED ORDER — METOPROLOL TARTRATE 25 MG PO TABS: 12.5000 mg | ORAL_TABLET | Freq: Two times a day (BID) | ORAL | Status: AC

## 2014-10-03 MED ORDER — METHYLNALTREXONE BROMIDE 12 MG/0.6ML ~~LOC~~ SOLN
8.0000 mg | Freq: Once | SUBCUTANEOUS | Status: AC
  Administered 2014-10-03: 8 mg via SUBCUTANEOUS
  Filled 2014-10-03: qty 0.6

## 2014-10-03 MED ORDER — METHYLNALTREXONE BROMIDE 12 MG/0.6ML ~~LOC~~ SOLN: 12.0000 mg | Freq: Once | SUBCUTANEOUS | Status: DC

## 2014-10-03 NOTE — Progress Notes (Signed)
Patient discharged to [facility].  EMS transporting.  Notified Ephriam Jenkins, Nurse, at [facility] patient leaving at this time.  Bolus dose of pain medication administered prior to transport to facility.  Room air.  No complaints from patient.  Patient husband aware of transfer and is on his way to [facility].

## 2014-10-03 NOTE — Progress Notes (Signed)
Daily Progress Note   Patient Name: Laura [name]       Date: 10/03/2014 DOB: [birth year]  Age: 44 y.o. MRN#: [id number] Attending Physician: Annita Brod, MD Primary Care Physician: [name], MD Admit Date: 09/29/2014  Reason for Consultation/Follow-up: Establishing goals of care and pain management adjustment.   Laura [name] is a 44 y.o. female with history of Metastatic Adenocarcinoma admitted with abdominal pain to the hospitalist service since 09-29-14.   Patient was diagnosed with metastatic adenocarcinoma after C section February 2015. She received CAPOX, then Avastin, restaging in August 2015 showed progression, she was referred to cancer treatment centers [facility], restaging in April 2016 showed progression. At her last visit with her oncologist in June 2016, she declined hospice services.   She has chronic pain from her tumor and presented with worsening of her abdominal pain. She has Dilaudid Po and MS contin at home for pain control, she complained of chronic constipation, recent CT shows extensive disease in the belly and also in the chest. She states that she last had chemo in March. She elected to be a FULL CODE at the time of this admission.   A palliative consult was placed for goals of care discussions and for symptom management on 09-30-14.   [place] ON 10-01-14:  Met with the patient, her husband, her parents and her sister in the room. The patient is resting in bed, she has received several dosages of IV Dilaudid in the past 24 hours, both scheduled and PRN, her pain is some what better controlled than it has been in the past. The patient has had a bowel movement as well, she has some nausea.   Patient and her husband produced 2 documents for my review, living will indicating desire for natural death with the exception of her choosing to receive artificial hydration, also reviewed HCPOA document designating husband Kristeena Meineke as her health care power of  attorney.   Discussed patient's low functional status, ongoing symptoms and imaging evidence of progression.   OUTCOME: The following decisions were made: 1. DNR DNI 2. SW consult for Hospice consult, possibly with hospice of [facility], the patient and her husband would like to meet with the hospice liaison and with the social worker in person for additional information.  3. The patient wishes to undergo palliative radiation for the next 2 weeks after her current hospitalization and then enroll in full hospice services.  4. Aggressive symptom management to continue: Dilaudid PCA for 24 hours, then change to appropriate equianalgesic doses of MS Contin+PO Oxycodone for her out patient regimen. Continue bowel regimen and anti emetic regimen.   10-02-14: Discussed with and appreciate Dr Gearldine Shown input.  Continue to adjust Dilaudid PCA, patient with increased upper abdominal pain currently. Husband states he would like for the patient's pain medication to be titrated while she is still in the hospital.  Agree with Lactulose.  [place] assistance, plan is for home with hospice possibly in 24-48 hours.   10-03-14: Discussed with RN, patient with ongoing decline, less awake less alert. Few PRN IV Dilaudid boluses needed, remains on 2 mg IV Dilaudid basal rate.  Still with no bowel movement. Will give Relistor today.  Patient anxious, tachycardic today. She is restless Discussed with patient and husband about D/C to hospice home today.  Discussed with Hospice RN Anderson Malta.  Patient will likely be D/c to hospice of [facility] today   Length of Stay: 4 days  Current Medications:  Scheduled Meds:  . feeding supplement (ENSURE ENLIVE)  237 mL Oral BID BM  . lactulose  20 g Oral BID  . metoprolol  2.5 mg Intravenous NOW  . metoprolol tartrate  12.5 mg Oral BID  . polyethylene glycol  17 g Oral BID  . predniSONE  20 mg Oral Q breakfast  . senna-docusate  2 tablet Oral BID    . sodium chloride  10-40 mL Intracatheter Q12H  . sodium chloride  3 mL Intravenous Q12H    Continuous Infusions: . sodium chloride 10 mL/hr at 10/02/14 0859  . HYDROmorphone 2 mg/hr (10/03/14 0625)    PRN Meds: acetaminophen **OR** acetaminophen, bisacodyl, HYDROmorphone, ondansetron **OR** ondansetron (ZOFRAN) IV, sodium chloride  Palliative Performance Scale: 20%     Vital Signs: BP 128/99 mmHg  Pulse 132  Temp(Src) 97.6 F (36.4 C) (Oral)  Resp 16  Ht $R'5\' 2"'iK$  (1.575 m)  Wt 57.834 kg (127 lb 8 oz)  BMI 23.31 kg/m2  SpO2 98% SpO2: SpO2: 98 % O2 Device: O2 Device: Not Delivered O2 Flow Rate:    Intake/output summary:   Intake/Output Summary (Last 24 hours) at 10/03/14 0850 Last data filed at 10/02/14 1800  Gross per 24 hour  Intake    200 ml  Output      0 ml  Net    200 ml   LBM:   Baseline Weight: Weight: 57.834 kg (127 lb 8 oz) Most recent weight: Weight: 57.834 kg (127 lb 8 oz)  Physical Exam: Pale weak appearing, in mild distress due to anxiety Not as awake alert Clear S1 S2 Abdomen mild distension, pain Non focal No edema  Additional Data Reviewed: No results for input(s): WBC, HGB, PLT, NA, BUN, CREATININE, ALB in the last 72 hours.   Problem List:  Patient Active Problem List   Diagnosis Date Noted  . Goals of care, counseling/discussion   . Pressure ulcer 09/30/2014  . Malnutrition of moderate degree 09/30/2014  . Nausea with vomiting   . Encounter for palliative care   . Abdominal pain 09/29/2014  . Tachycardia 09/29/2014  . Inadequate pain control 09/29/2014  . Hypertension 11/03/2013  . Transaminitis 11/03/2013  . Hyperphosphatemia 11/03/2013  . Hypoalbuminemia due to protein-calorie malnutrition 11/03/2013  . Thrombocytopenia, unspecified 11/03/2013  . 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 Nonspecific (abnormal) findings on radiological and other examination of gastrointestinal tract 06/12/2013  . 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 Postpartum care following cesarean delivery (05/20/13) 05/20/2013  . S/P repeat low transverse C-section 05/20/2013  . Suspected fetal chromosome anomaly affecting antepartum care of mother 12/24/2012     Palliative Care Assessment & Plan    Code Status:  DNR  Goals of Care:   likely D/C to inpatient hospice today  3. Symptom Management:   Dilaudid PCA    Ativan PRN  4. Palliative Prophylaxis:  Stool Softener: yes  5. Prognosis: days  5. Discharge Planning: Home with Hospice   Care plan was discussed with: patient, husband, RN and Dr Maryland Pink Thank you for allowing the Palliative Medicine Team to assist in the care of this patient.   Time In: 0900 Time Out: 0935 Total Time 35 Prolonged Time Billed no    Greater than 50%  of this time was spent counseling and coordinating care related to the above assessment and plan.   Loistine Chance, MD  10/03/2014, 8:50 AM  [phone]  Please contact Palliative Medicine Team phone at [phone] for  questions and concerns.

## 2014-10-03 NOTE — Clinical Social Work Placement (Signed)
   CLINICAL SOCIAL WORK PLACEMENT  NOTE  Date:  10/03/2014  Patient Details  Name: ADRIENE KNIPFER MRN: [id number] Date of Birth: 1970-07-04  Clinical Social Work is seeking post-discharge placement for this patient at the   level of care (*CSW will initial, date and re-position this form in  chart as items are completed):      Patient/family provided with [place] Work Department's list of facilities offering this level of care within the geographic area requested by the patient (or if unable, by the patient's family).      Patient/family informed of their freedom to choose among providers that offer the needed level of care, that participate in Medicare, Medicaid or managed care program needed by the patient, have an available bed and are willing to accept the patient.      Patient/family informed of [facility] ownership interest in [facility] and [facility], as well as of the fact that they are under no obligation to receive care at these facilities.  PASRR submitted to EDS on       PASRR number received on       Existing PASRR number confirmed on       FL2 transmitted to all facilities in geographic area requested by pt/family on       FL2 transmitted to all facilities within larger geographic area on       Patient informed that his/her managed care company has contracts with or will negotiate with certain facilities, including the following:            Patient/family informed of bed offers received.  Patient chooses bed at  [facility] hospice home)     Physician recommends and patient chooses bed at      Patient to be transferred to  [facility]) on  .  Patient to be transferred to facility by  (ambulance)     Patient family notified on   of transfer.  Name of family member notified:   Alvester Chou Olam Idler)     PHYSICIAN       Additional Comment:    _______________________________________________ Carlean Jews, LCSW 10/03/2014,  5:05 PM

## 2014-10-03 NOTE — Discharge Summary (Signed)
Discharge Summary  Laura [name] BWL:[id number] DOB: 05-23-1970  PCP: [name], MD  Admit date: 09/29/2014 Discharge date: 10/03/2014  Time spent: 25 minutes  Recommendations for Outpatient Follow-up:  1. Patient is being discharged to hospice facility 2. New medication: Dilaudid continuous drip 3. New medication: Lopressor 12.5 no grams by mouth twice a day 4. Medication change: Patient's by mouth MS Contin, by mouth Dilaudid and Norvasc are being discontinued 5. New medication: Relastor injection as needed for constipation  Discharge Diagnoses:  Active Hospital Problems   Diagnosis Date Noted  . Anxiety state   . Goals of care, counseling/discussion   . Pressure ulcer 09/30/2014  . Malnutrition of moderate degree 09/30/2014  . Nausea with vomiting   . Encounter for palliative care   . Abdominal pain 09/29/2014  . Tachycardia 09/29/2014  . Inadequate pain control 09/29/2014  . Hypertension 11/03/2013  . 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    Discharge Condition: Declining. 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 CT on admission noted findings of mass effect from large tumor, ascites along with constipation versus questionable sigmoid colonic obstruction.   Hospital Course:  Palliative care consulted. After a family meeting on  6/30 with palliative care, patient decided to be a DO NOT RESUSCITATE. Patient required more and more doses of pain medication and eventually was started on Dilaudid drip. Initially plan was for palliative radiation therapy, but after some discussion, patient has decided to hold off on this for now. She underwent a therapeutic paracentesis done 7/1 for ascites. She's continued to remain tachycardic during the hospitalization felt to be secondary to pain initial plan was for patient to go home with home hospice and a Dilaudid drip, with pain still persisting and need for adjusting medications, patient and her husband were apprehensive about going home given her overall decline, were supportive of patient going to hospice facility.  Patient continued to struggle with constipation despite attempts including lactulose, MiraLAX, suppositories, Fleet enema, magnesium citrate and injections of Relistor were given on day of discharge.  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   Hypertension: Norvasc substituted for beta blocker for tachycardia    Abdominal pain: Felt to be secondary to tumor    Tachycardia   Inadequate pain control   Nausea with vomitingColon secondary to constipation, pain medications, chronic pain     Malnutrition of moderate degree: Comfort feeds at this point    Goals of care, counseling/discussion   Anxiety state: When necessary Ativan   Procedures:  Status post therapeutic paracentesis done 7/1  Consultations:  Oncology  Palliative care  Radiation oncology  Interventional radiology   Discharge Exam: BP 128/99 mmHg  Pulse 132  Temp(Src) 97.6 F (36.4 C) (Oral)  Resp 16  Ht 5\' 2"  (1.575 m)  Wt 57.834 kg (127 lb 8 oz)  BMI 23.31 kg/m2  SpO2  98%  General: fatigued, alert and oriented 2, mild distress secondary to chronic pain  Cardiovascular: Regular rhythm, tachycardic  Respiratory: Clear to auscultation  bilaterally   Discharge Instructions You were cared for by a hospitalist during your hospital stay. If you have any questions about your discharge medications or the care you received while you were in the hospital after you are discharged, you can call the unit and asked to speak with the hospitalist on call if the hospitalist that took care of you is not available. Once you are discharged, your primary care physician will handle any further medical issues. Please note that NO REFILLS for any discharge medications will be authorized once you are discharged, as it is imperative that you return to your primary care physician (or establish a relationship with a primary care physician if you do not have one) for your aftercare needs so that they can reassess your need for medications and monitor your lab values.  Discharge Instructions    Diet - low sodium heart healthy    Complete by:  As directed      Increase activity slowly    Complete by:  As directed             Medication List    STOP taking these medications        amLODipine 10 MG tablet  Commonly known as:  NORVASC     amLODipine 5 MG tablet  Commonly known as:  NORVASC     HYDROmorphone 4 MG tablet  Commonly known as:  DILAUDID     morphine 15 MG 12 hr tablet  Commonly known as:  MS CONTIN     morphine 30 MG 12 hr tablet  Commonly known as:  MS CONTIN      TAKE these medications        acetaminophen 325 MG tablet  Commonly known as:  TYLENOL  Take 2 tablets (650 mg total) by mouth every 6 (six) hours as needed for mild pain (or Fever >/= 101).     bisacodyl 10 MG suppository  Commonly known as:  DULCOLAX  Place 1 suppository (10 mg total) rectally daily as needed for moderate constipation or severe constipation (try first dose today).     feeding supplement (ENSURE ENLIVE) Liqd  Take 237 mLs by mouth 2 (two) times daily between meals.     HYDROmorphone 50 mg in sodium chloride 0.9 % 95 mL  Inject 1 mg/hr into  the vein continuous.     lactulose 10 GM/15ML solution  Commonly known as:  CHRONULAC  Take 30 mLs (20 g total) by mouth 2 (two) times daily.     LORazepam 2 MG/ML injection  Commonly known as:  ATIVAN  Inject 0.5 mLs (1 mg total) into the vein every 3 (three) hours as needed for anxiety.     methylnaltrexone 12 MG/0.6ML Soln injection  Commonly known as:  RELISTOR  Inject 0.6 mLs (12 mg total) into the skin once.     metoprolol tartrate 25 MG tablet  Commonly known as:  LOPRESSOR  Take 0.5 tablets (12.5 mg total) by mouth 2 (two) times daily.     ondansetron 4 MG tablet  Commonly known as:  ZOFRAN  Take 4 mg by mouth every 8 (eight) hours as needed for nausea or vomiting.     OVER THE COUNTER MEDICATION  Take 2 tablets by mouth at bedtime. Otc. Calms forte     polyethylene glycol packet  Commonly known as:  MIRALAX / GLYCOLAX  Take 17 g by mouth daily as needed for moderate constipation.     predniSONE 20 MG tablet  Commonly known as:  DELTASONE  Take 1 tablet (20 mg total) by mouth daily with breakfast.     senna-docusate 8.6-50 MG per tablet  Commonly known as:  Senokot-S  Take 2 tablets by mouth 2 (two) times daily.       No Known Allergies    The results of significant diagnostics from this hospitalization (including imaging, microbiology, ancillary and laboratory) are listed below for reference.    Significant Diagnostic Studies: Ct Abdomen Pelvis Wo Contrast  09/29/2014   CLINICAL DATA:  Constipation. 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  EXAM: CT ABDOMEN AND PELVIS WITHOUT CONTRAST  TECHNIQUE: Multidetector CT imaging of the abdomen and pelvis was performed following the standard protocol without IV contrast.  COMPARISON:  09/22/2014  FINDINGS: Lower chest: Pulmonary metastasis. Index 1.0 cm nodule on image 6 measured 9 mm on the prior. Normal heart size without pericardial or pleural effusion.  Hepatobiliary: Degraded evaluation of the abdomen secondary to  lack of IV contrast and beam hardening artifact from extensive overlying support apparatus. Grossly normal liver, gallbladder, and biliary tract.  Pancreas: Pancreatic atrophy, without acute inflammation.  Spleen: Similar appearance of multi focal splenic infarcts.  Adrenals/Urinary Tract: Normal adrenal glands. No renal calculi or hydronephrosis. Normal urinary bladder.  Stomach/Bowel: Normal caliber of the stomach. The sigmoid is decompressed, possibly secondary to mass effect by pelvic tumor. The remainder of the colon is stool and gas filled. Large amount of stool throughout the remainder of the colon. In the region of the hepatic flexure, measures 6.5 cm today versus 5.3 cm on 09/22/2014. No small bowel obstruction, pneumatosis, or free intraperitoneal air.  Vascular/Lymphatic: Normal caliber of the aorta and branch vessels. Retroperitoneum not well evaluated for adenopathy. None present on recent contrast-enhanced study. Right external iliac adenopathy at 1.4 cm on image 70, similar.  Reproductive: Uterus not well evaluated.  Other: Pelvic mass or conglomerate masses again identified. On the order of 9.2 x 11.7 cm on image 70 of series 2. Felt to be similar to 09/22/2014 (when remeasured). Slight increase in loculated fluid anterior to the urinary bladder, including on image 74. Widespread omental and peritoneal metastasis throughout the abdomen, better evaluated on recent contrast-enhanced CT. Example right omental abdominal wall mass of 7.6 cm on image 54 versus 7.1 cm on the prior exam (when remeasured).  Left rectus muscle mass is similar at the 3.4 x 7.0 cm.  Musculoskeletal: Vague T10 increased density, suspicious for osseous metastasis. Other areas of heterogeneous marrow density including within the L5 vertebral body are indeterminate but suspicious.  IMPRESSION: 1. mild degradation secondary to motion and lack of IV contrast. 2. Large volume abdominal pelvic tumor, similar and slightly increased, with  mass effect upon the sigmoid colon and increase in more proximal colonic diameter. Findings suggest constipation and possibly a component of colonic obstruction at the level of the sigmoid. No small bowel obstruction. 3. Pulmonary metastasis, likely progressive since 09/22/2014. 4. Similar volume of ascites. 5. Grossly similar splenic infarcts. 6. Suspicion of osseous metastasis.   Electronically Signed   By: Abigail Miyamoto M.D.   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 Sagittal and coronal MPR images reconstructed from axial data set.  CONTRAST:  67mL OMNIPAQUE IOHEXOL 300 MG/ML SOLN IV. Dilute oral contrast.  COMPARISON:  06/06/2013, 08/16/2014  FINDINGS: Small LEFT pleural effusion.  Numerous pulmonary nodules at the lung bases compatible with multiple pulmonary metastases up to 9 mm diameter image 2.  Tip of Port-A-Cath in RIGHT atrium.  7 mm low-attenuation focus lateral segment LEFT lobe liver image 12.  Remainder of liver, pancreas, kidneys, and adrenal glands normal.  Large area of low attenuation at anterior/inferior aspect of spleen favor splenic infarct over metaphysis unchanged from 08/16/2014.  Significant ascites with multiple soft tissue masses primarily in the mid abdomen and pelvis compatible with peritoneal carcinomatosis.  Conglomerate tumor mass in upper central pelvis measures 16.8 x 8.4 x 12.4 cm.  Soft tissue mass invades the rectus muscles of the anterior pelvis bilaterally greater on LEFT, LEFT rectus mass measuring 7.5 x 3.6 x 5.7  cm.  Pelvic tumor masses contiguous with superior aspect of urinary bladder cannot exclude bladder wall invasion.  Uterus and ovaries obscured by large pelvic mass.  Extensive mesenteric adenopathy and omental caking.  Stomach and bowel loops grossly unremarkable without evidence of obstruction.  No free intraperitoneal air, hernia, or acute osseous findings.  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  Associated omental caking, mesenteric adenopathy, and anterior abdominal wall tumor extension.  Multiple pulmonary metastases and new small LEFT pleural effusion.  Question single metastasis at lateral segment LEFT lobe liver, new.  Large area of low attenuation within the anterior inferior spleen favoring splenic infarct or sequela of prior intervention over metastasis.  Cannot exclude bladder wall invasion by tumor at the superior margin of the urinary bladder.  Findings called to Dr. Alvino Chapel on 09/22/2014 at 0920 hours.   Electronically Signed   By: Lavonia Dana M.D.   On: 09/22/2014 09:23   US Paracentesis  09/22/2014   INDICATION: ascites  EXAM: ULTRASOUND-GUIDED PARACENTESIS  COMPARISON:  Previous para  MEDICATIONS: 10 cc 1% lidocaine.  COMPLICATIONS: None immediate  TECHNIQUE: Informed written consent was obtained from the patient after a discussion of the risks, benefits and alternatives to treatment. A timeout was performed prior to the initiation of the procedure.  Initial ultrasound scanning demonstrates a large amount of ascites within the right lower abdominal quadrant. The right lower abdomen was prepped and draped in the usual sterile fashion. 1% lidocaine with epinephrine was used for local anesthesia. Under direct ultrasound guidance, a 19 gauge, 7-cm, Yueh catheter was introduced. An ultrasound image was saved for documentation purposed. The paracentesis was performed. The catheter was removed  and a dressing was applied. The patient tolerated the procedure well without immediate post procedural complication.  FINDINGS: A total of approximately 1.9 liters of yellow fluid was removed.  IMPRESSION: Successful ultrasound-guided paracentesis yielding 1.9 liters of peritoneal fluid.  Read by:  Lavonia Drafts [facility]   Electronically Signed   By: Jerilynn Mages.  Shick M.D.   On: 09/22/2014 16:32   US Paracentesis  09/08/2014   CLINICAL DATA:  44 year old female with a history of appendiceal carcinoma, referred for image guided paracentesis.  EXAM: ULTRASOUND GUIDED  PARACENTESIS  COMPARISON:  Prior CT 06/06/2013  PROCEDURE: An  ultrasound guided paracentesis was thoroughly discussed with the patient and questions answered. The benefits, risks, alternatives and complications were also discussed. The patient understands and wishes to proceed with the procedure. Written consent was obtained.  Ultrasound was performed to localize and mark an adequate pocket of fluid in the right lower quadrant of the abdomen. The area was then prepped and draped in the normal sterile fashion. 1% Lidocaine was used for local anesthesia. Under ultrasound guidance a Safe-T-Centesis catheter was introduced. Paracentesis was performed. The catheter was removed and a dressing applied.  COMPLICATIONS: None.  FINDINGS: A total of approximately 6 L of thin yellow fluid was removed. A fluid sample was sent for laboratory analysis.  IMPRESSION: Status post ultrasound-guided paracentesis, with a sample sent to the lab for complete analysis.  Signed,  Dulcy Fanny. Earleen Newport, DO  [facility]  [facility]  PLAN: The patient received some care at [facility] location for [place], with a prior conversation about potentially inserting a PleurX catheter for abdominal drainage. The husband reports that this may be something they would explore hearing at [facility], however for now, they will maintain the  periodic ultrasound-guided paracentesis.   Electronically Signed   By: Corrie Mckusick D.O.   On: 09/08/2014 18:20    Microbiology: No results found for this or any previous visit (from the past 240 hour(s)).   Labs: Basic Metabolic Panel:  Recent Labs Lab 09/29/14 1531 09/30/14 0635  NA 141 142  K 3.9 3.5  CL 99* 104  CO2 28 25  GLUCOSE 95 95  BUN 19 17  CREATININE 0.59 0.51  CALCIUM 9.3 8.6*   Liver Function Tests:  Recent Labs Lab 09/30/14 0635  AST 52*  ALT 70*  ALKPHOS 260*  BILITOT 1.9*  PROT 6.3*  ALBUMIN 3.0*   No results for input(s): LIPASE, AMYLASE in the last 168 hours. No results for input(s): AMMONIA in the last 168 hours. CBC:  Recent Labs Lab 09/29/14 1531 09/30/14 0635  WBC 11.4* 11.9*  NEUTROABS 9.2*  --   HGB 12.9 12.5  HCT 41.5 40.2  MCV 84.5 85.4  PLT 261 239   Cardiac Enzymes: No results for input(s): CKTOTAL, CKMB, CKMBINDEX, TROPONINI in the last 168 hours. BNP: BNP (last 3 results) No results for input(s): BNP in the last 8760 hours.  ProBNP (last 3 results) No results for input(s): PROBNP in the last 8760 hours.  CBG:  Recent Labs Lab 09/30/14 0733 10/01/14 0715 10/02/14 0805 10/03/14 0759  GLUCAP 88 75 84 109*       Signed:  Zaylee Cornia K  [facility] 10/03/2014, 11:46 AM

## 2014-10-03 NOTE — Progress Notes (Signed)
Hospice requests PAC be left accessed upon discharge.  Obtained a verbal order from Dr. [place] Pink to keep [facility] accessed upon discharge to [facility].

## 2014-10-03 NOTE — Progress Notes (Signed)
Gave report to Ephriam Jenkins, Nurse, at [facility].  [phone].  Patient being discharged and admitted to [facility].  Patient being admitted to room 105.  Hospice reports Dilaudid medication is at the facility and ready for patient upon arrival.

## 2014-10-06 ENCOUNTER — Ambulatory Visit: Payer: BLUE CROSS/BLUE SHIELD | Admitting: Radiation Oncology

## 2014-10-06 NOTE — Procedures (Signed)
Successful US guided paracentesis from RLQ.  Yielded 1.6 liters of clear yellow fluid.  No immediate complications.  Pt tolerated well.   Specimen was not sent for labs.  Judie Grieve Jarom Govan PA-C 10/06/2014 12:44 PM

## 2014-10-07 ENCOUNTER — Telehealth: Payer: Self-pay | Admitting: *Deleted

## 2014-10-07 ENCOUNTER — Ambulatory Visit: Payer: BLUE CROSS/BLUE SHIELD

## 2014-10-07 NOTE — Telephone Encounter (Signed)
Received call back from pt's husband and he states "Laura [name] died 07/09/2022 at the Hospice in [place]"  Sympathy given and informed husband Dr. Benay Spice will be made aware.

## 2014-10-07 NOTE — Telephone Encounter (Signed)
-----   Message from Ladell Pier, MD sent at 10/06/2014  8:43 PM EDT ----- Pt. At [facility] Please call husband and see if she would like to schedule f/u visit We can ask Dr. Hinton Rao to see her if it is too hard for her to come here. ----- Message -----    From: Rad Results In Interface    Sent: 10/06/2014   1:44 PM      To: Ladell Pier, MD

## 2014-10-07 NOTE — Telephone Encounter (Signed)
Per Dr. Benay Spice; left voice message for Mr. Twaddle re: follow up visit at [facility] or MD can ask Dr. Hinton Rao to see her if to hard for pt to come here.  Requested Pt's husband to call back.

## 2014-10-08 ENCOUNTER — Ambulatory Visit: Payer: BLUE CROSS/BLUE SHIELD

## 2014-10-09 ENCOUNTER — Ambulatory Visit: Payer: BLUE CROSS/BLUE SHIELD

## 2014-10-12 ENCOUNTER — Ambulatory Visit: Payer: BLUE CROSS/BLUE SHIELD

## 2014-10-12 ENCOUNTER — Other Ambulatory Visit: Payer: Self-pay | Admitting: Radiation Oncology

## 2014-10-12 NOTE — Progress Notes (Signed)
I had previously discussed possible palliative radiation treatment with the patient. Her status was declining in recent weeks, and she was going to further clarify her goals with palliative medicine. She made the decision to transfer to a hospice facility upon discharge. Radiation would have been of modest benefit given her case, so I do not feel strongly that this should be pursued. I would be happy to see the patient again if needed.  ------------------------------------------------  Jodelle Gross, MD, PhD

## 2014-10-13 ENCOUNTER — Ambulatory Visit: Payer: BLUE CROSS/BLUE SHIELD

## 2014-10-14 ENCOUNTER — Ambulatory Visit: Payer: BLUE CROSS/BLUE SHIELD

## 2014-10-15 ENCOUNTER — Ambulatory Visit: Payer: BLUE CROSS/BLUE SHIELD

## 2014-10-16 ENCOUNTER — Ambulatory Visit: Payer: BLUE CROSS/BLUE SHIELD

## 2014-10-19 ENCOUNTER — Ambulatory Visit: Payer: BLUE CROSS/BLUE SHIELD

## 2014-10-20 ENCOUNTER — Ambulatory Visit: Payer: BLUE CROSS/BLUE SHIELD

## 2014-10-21 ENCOUNTER — Ambulatory Visit: Payer: BLUE CROSS/BLUE SHIELD

## 2014-10-22 ENCOUNTER — Ambulatory Visit: Payer: BLUE CROSS/BLUE SHIELD

## 2014-10-23 ENCOUNTER — Ambulatory Visit: Payer: BLUE CROSS/BLUE SHIELD

## 2014-10-26 ENCOUNTER — Ambulatory Visit: Payer: BLUE CROSS/BLUE SHIELD

## 2014-11-02 DEATH — deceased

## 2015-07-09 IMAGING — CT CT ABD-PELV W/ CM
2 of 6 series · 16 of 46 positions shown, 18 images · IV contrast (omnipaque)
Comparison: None.

CLINICAL DATA: Patient was discovered to have a peritoneal nodule
at time ofcesarean section. Nodule at pathologic examination
revealed adenocarcinoma.

EXAM:
CT CHEST, ABDOMEN, AND PELVIS WITH CONTRAST
TECHNIQUE: Multidetector CT imaging of the chest, abdomen and pelvis was
performed following the standard protocol during bolus
administration of intravenous contrast.
CONTRAST:  100mL OMNIPAQUE IOHEXOL 300 MG/ML SOLN

[Series 2: cap with st · axial · 0.74mm/px · z∈[-557,-42]mm · 13 of 119 slices shown, 15 images]
[im 8/119  soft-tissue]
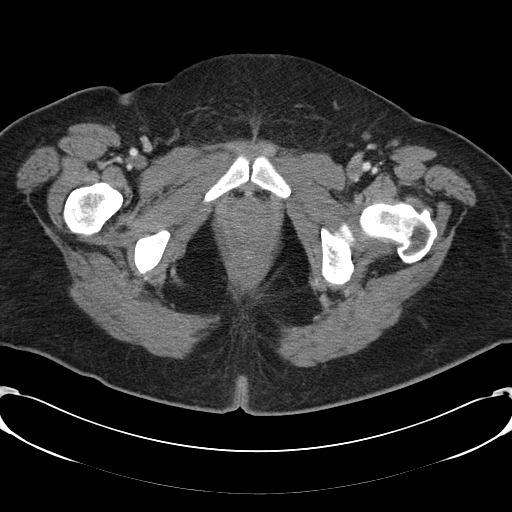
[im 8/119  bone]
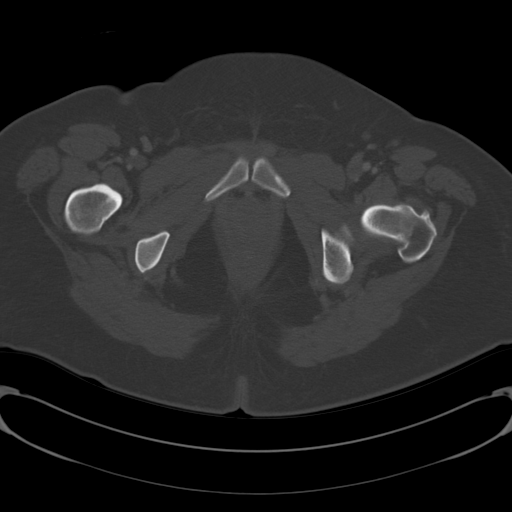
[im 16/119  soft-tissue]
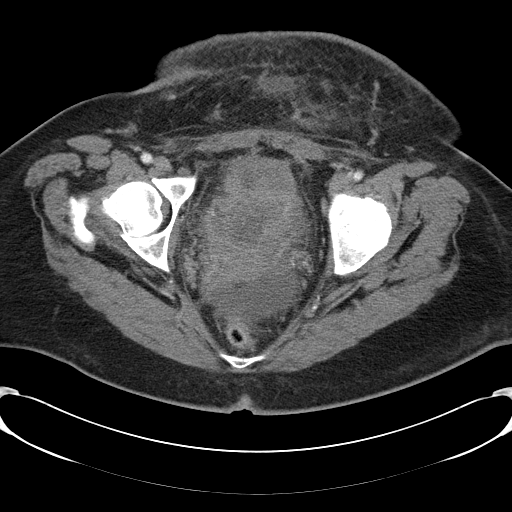
[im 24/119  soft-tissue]
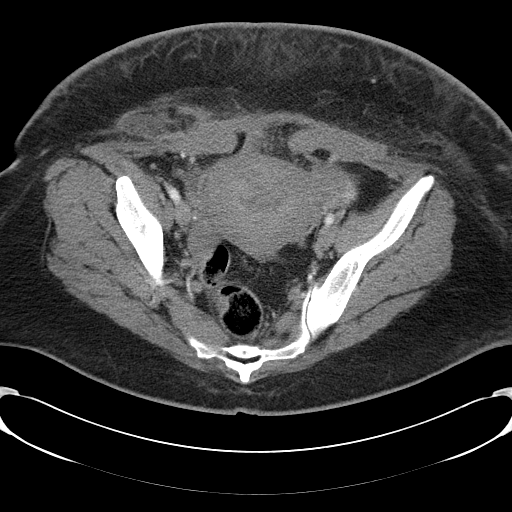
[im 32/119  soft-tissue]
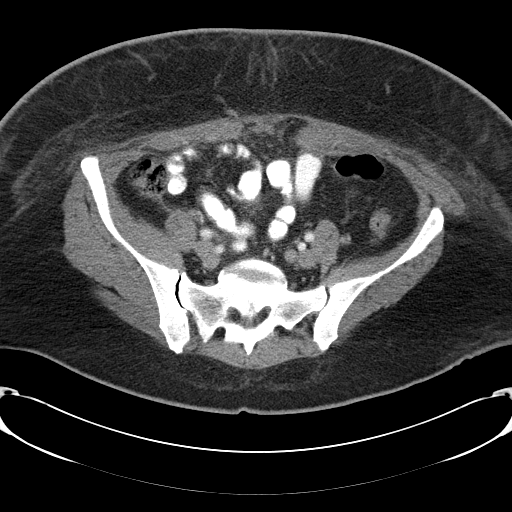
[im 40/119  soft-tissue]
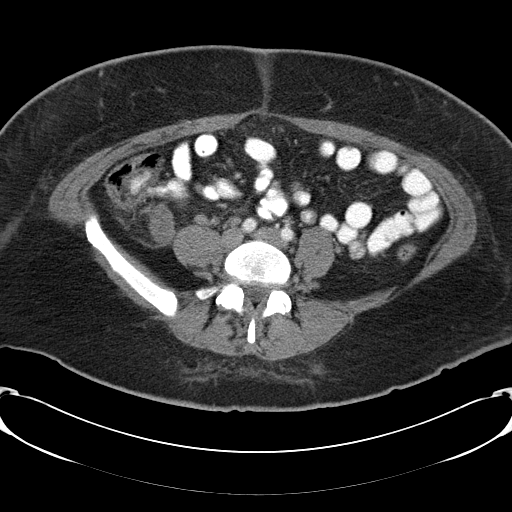
[im 48/119  soft-tissue]
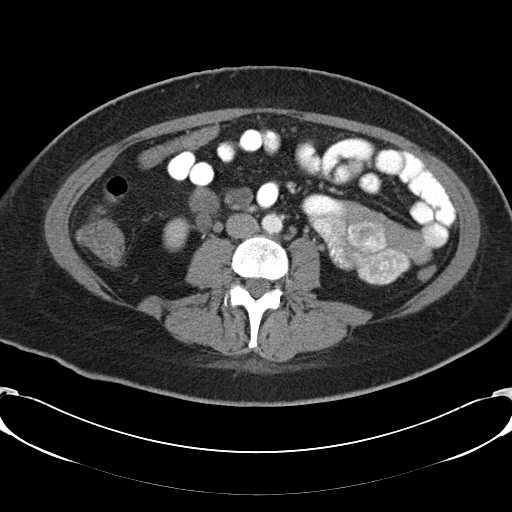
[im 63/119  soft-tissue]
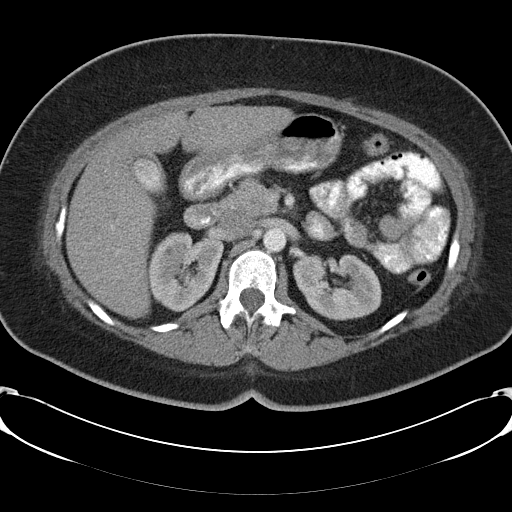
[im 71/119  soft-tissue]
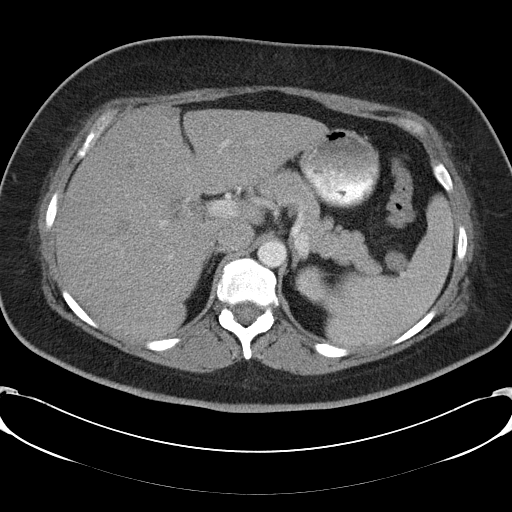
[im 79/119  soft-tissue]
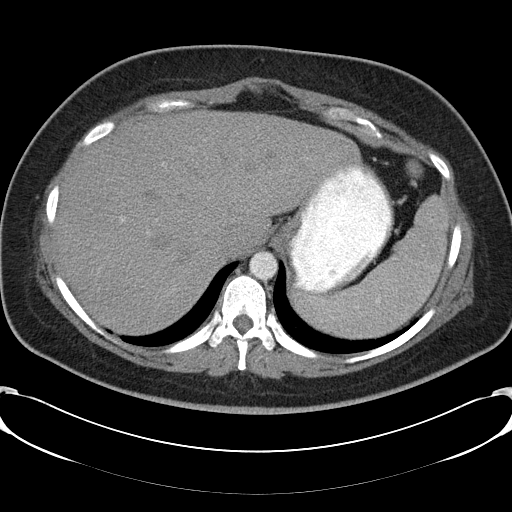
[im 79/119  bone]
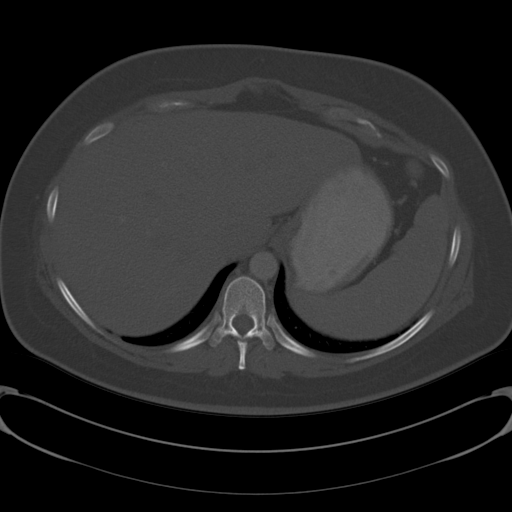
[im 87/119  soft-tissue]
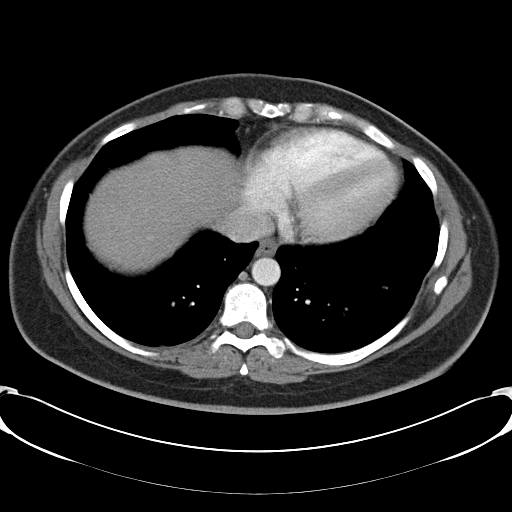
[im 95/119  soft-tissue]
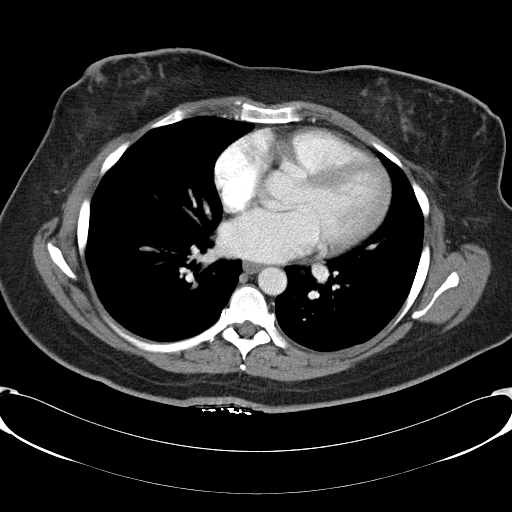
[im 103/119  soft-tissue]
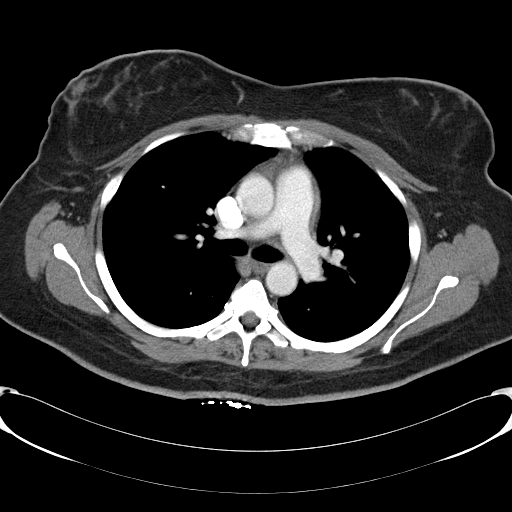
[im 111/119  soft-tissue]
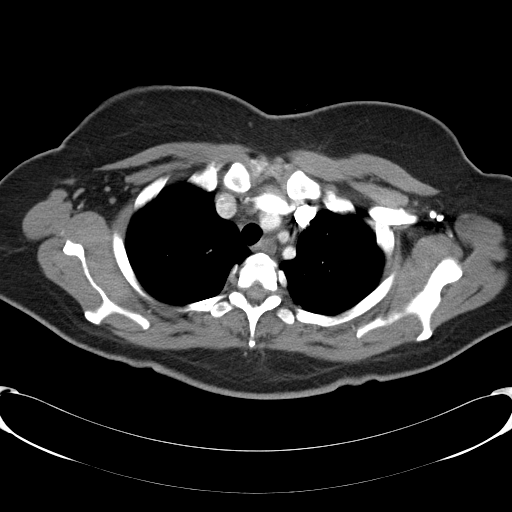

[Series 602: <mpr thick range> · coronal · 1.16mm/px · 3 of 93 slices shown]
[im 31/93  soft-tissue]
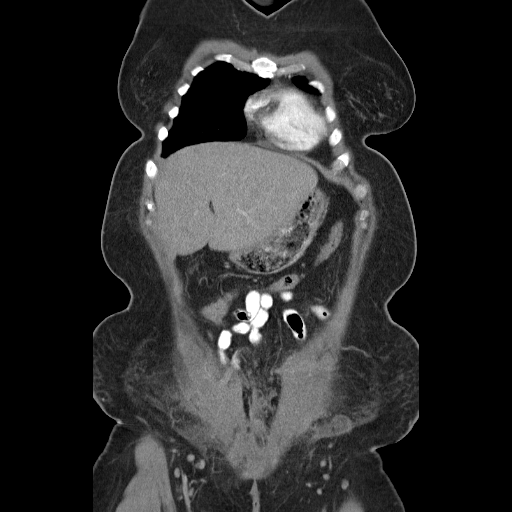
[im 41/93  soft-tissue]
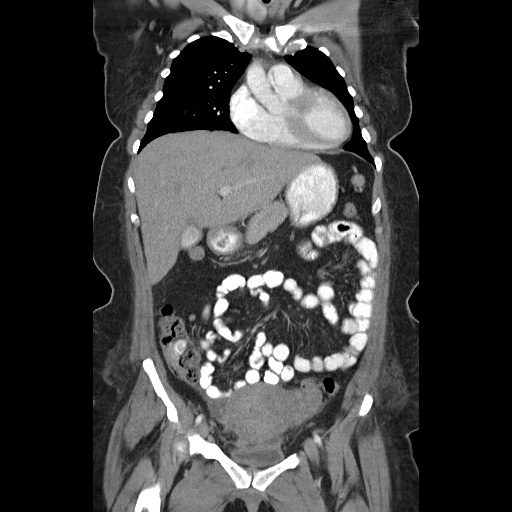
[im 52/93  soft-tissue]
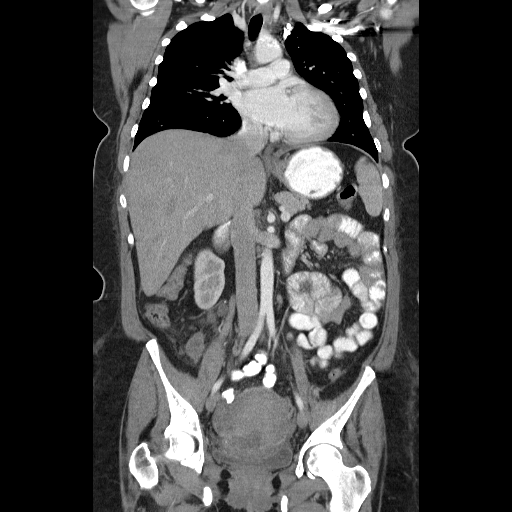

[16 of 46 positions shown; findings below may reference images not displayed]

FINDINGS: CT CHEST FINDINGS

No axillary or supraclavicular lymphadenopathy. No mediastinal hilar
lymphadenopathy. No pericardial fluid. Esophagus is normal.

Review of the lung parenchyma demonstrates no parenchymal
nodularity. There is mild subpleural nodular thickening / nodularity
in the lower lobes which are less than 5 mm each. Airways are
normal.

CT ABDOMEN AND PELVIS FINDINGS

There is no focal hepatic lesion. Gallbladder contents high-density
material which could represent sludge. The pancreas, spleen, adrenal
glands, and kidneys are normal.

Stomach and small bowel are normal. There is a mass extending from
the medial aspect of the cecal tip which is felt to represent an
abnormal appendix. This measures 23 mm in diameter (image 82, series
2). There appears to be a lumen at the orifice while the rest of the
presumed appendix is obliterated. This tubular structure measures
approximately 36 mm.

There are metastatic lymph nodes extending into the ileocecal
mesentery. Example node measures 21 mm short axis (image 75). More
central node measures 14 mm short axis (image 72). There are
approximately 8 of these metastatic lymph nodes in the ileocecal
mesentery. No evidence of colonic obstruction. The terminal ileum
appears normal. The ascending, transverse, and descending colon
appear normal. The sigmoid colon is collapsed and difficult to
follow. There is a significant amount of stranding within the fat
ventral to the uterus consists with recent cesarean section. No
obstructing lesion identified.

Abdominal or is normal caliber. No retroperitoneal or periportal
lymphadenopathy.

Smaller free fluid the pelvis. The uterus demonstrates changes
consistent with surgery. The ovaries are normal. Postsurgical
collection in the subcutaneous tissues of the suprapubic abdomen.

There is a rounded lymph node along the left external iliac vein
measuring 10 mm (image 99, series 2). There is 8 mm right external
iliac lymph node (image 97. No aggressive osseous lesion.
IMPRESSION: 1. Irregular mass extending from the medial aspect of the cecal tip
is felt to represent carcinoma of the appendix.
2. Multiple metastatic lymph nodes within the ileocecal mesentery.
3. No additional evidence of GI carcinoma. Sigmoid colon poorly
evaluated.
4. Small left external iliac lymph node is indeterminate.
5. No evidence distant metastasis.
6. Postsurgical change in the pelvis related to recent cesarean
section.

## 2016-10-28 IMAGING — US US PARACENTESIS
1 series · 7 of 7 positions shown · non-contrast
Comparison: Previous para

MEDICATIONS:
10 cc 1% lidocaine.

COMPLICATIONS:
None immediate

INDICATION: ascites

EXAM:
ULTRASOUND-GUIDED PARACENTESIS
TECHNIQUE: Informed written consent was obtained from the patient after a
discussion of the risks, benefits and alternatives to treatment. A
timeout was performed prior to the initiation of the procedure.

[Series 1: us paracentesis · 0.22mm/px · 7 of 7 slices shown]
[im 1/7]
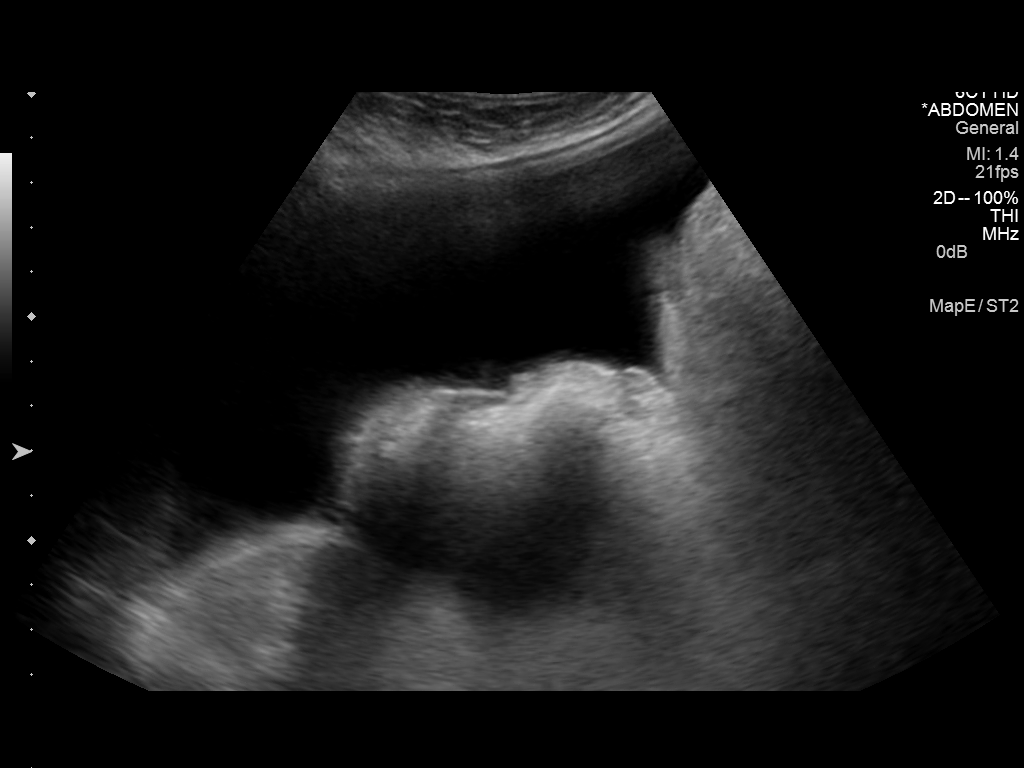
[im 2/7]
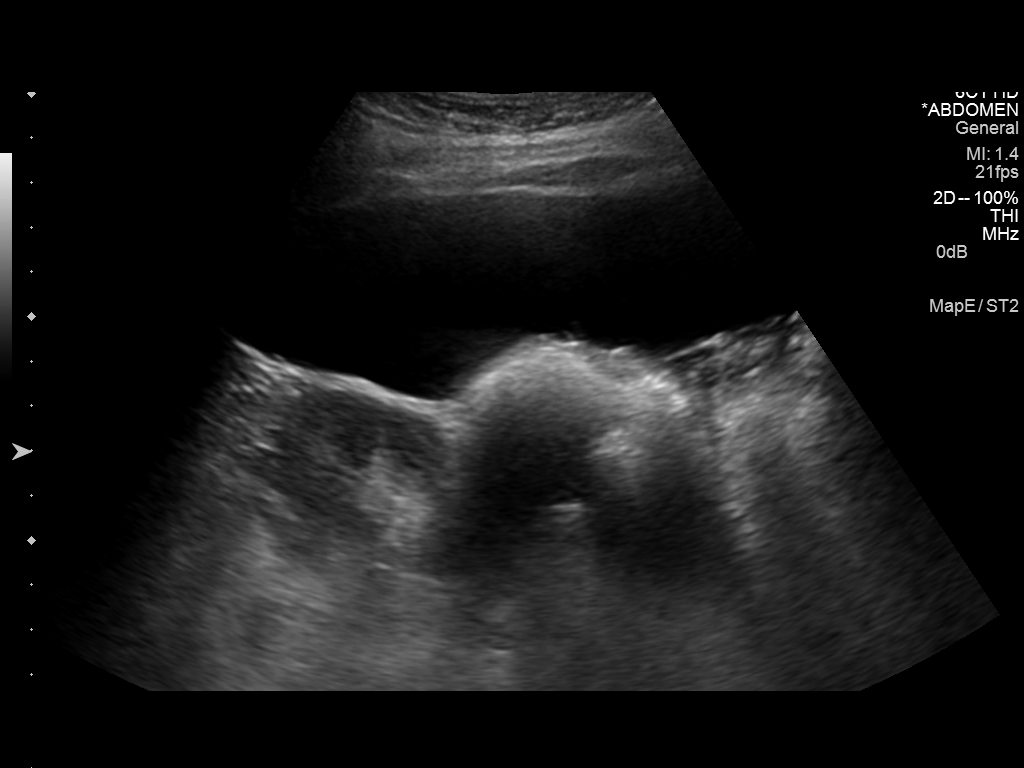
[im 3/7]
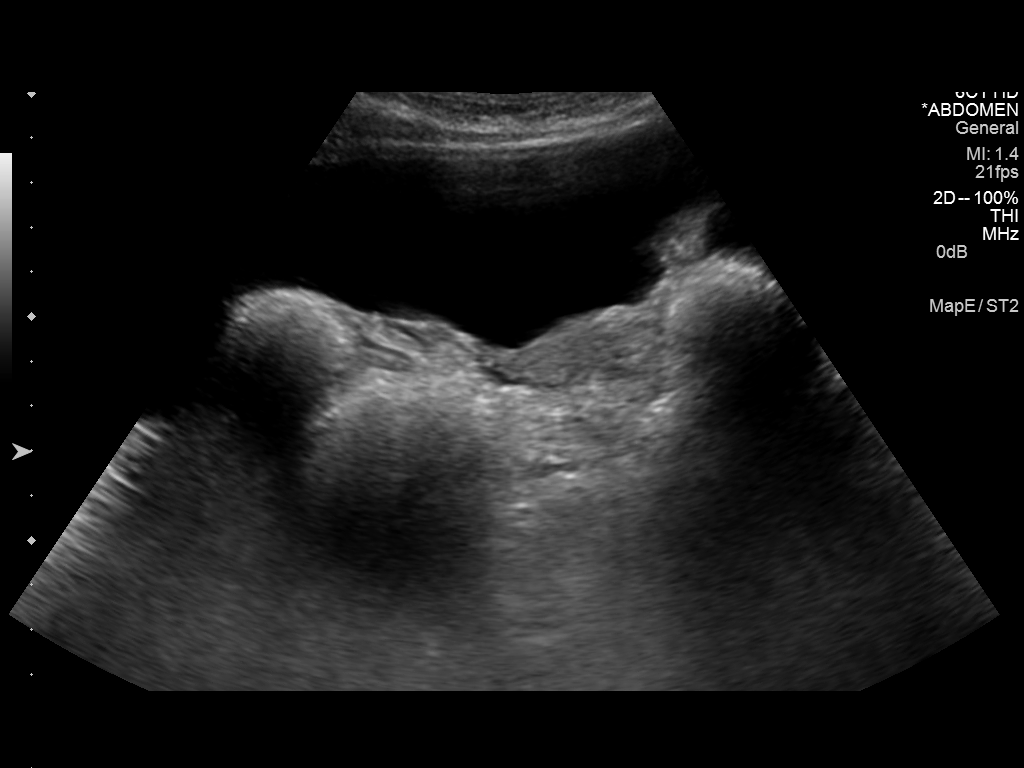
[im 4/7]
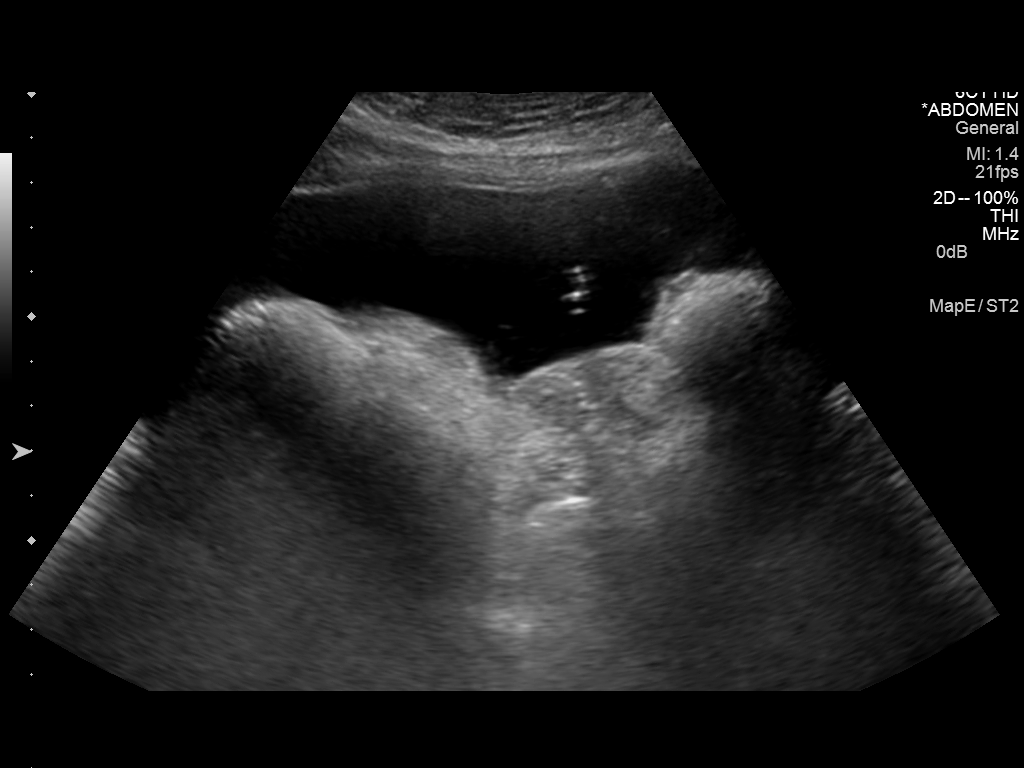
[im 5/7]
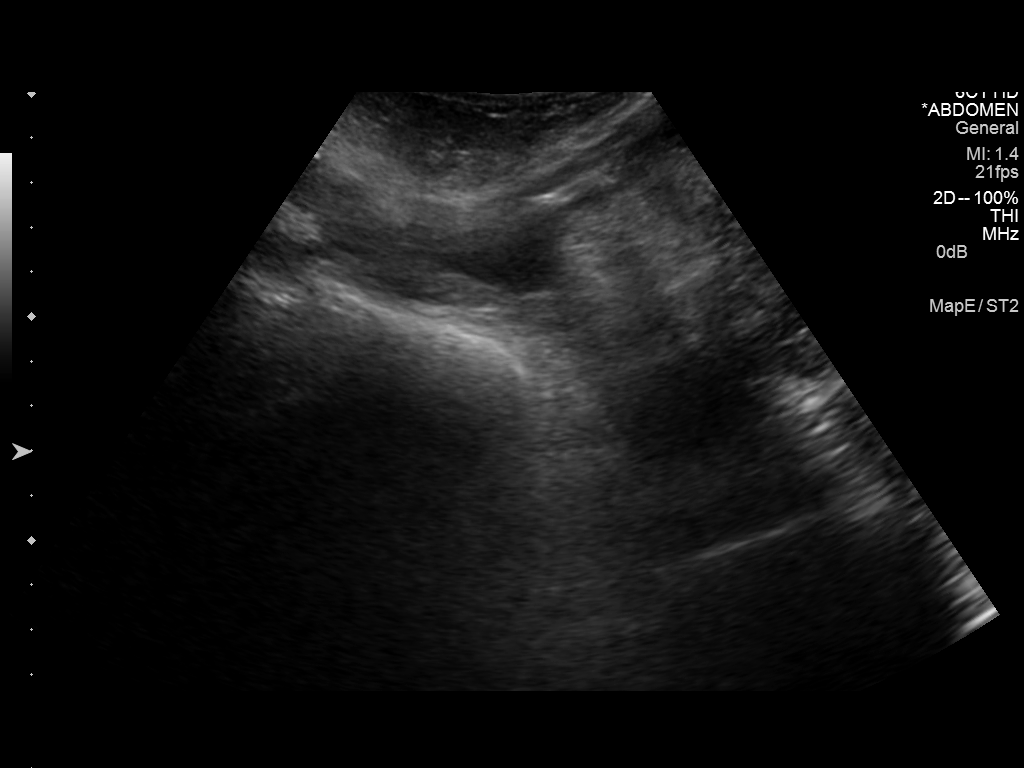
[im 6/7]
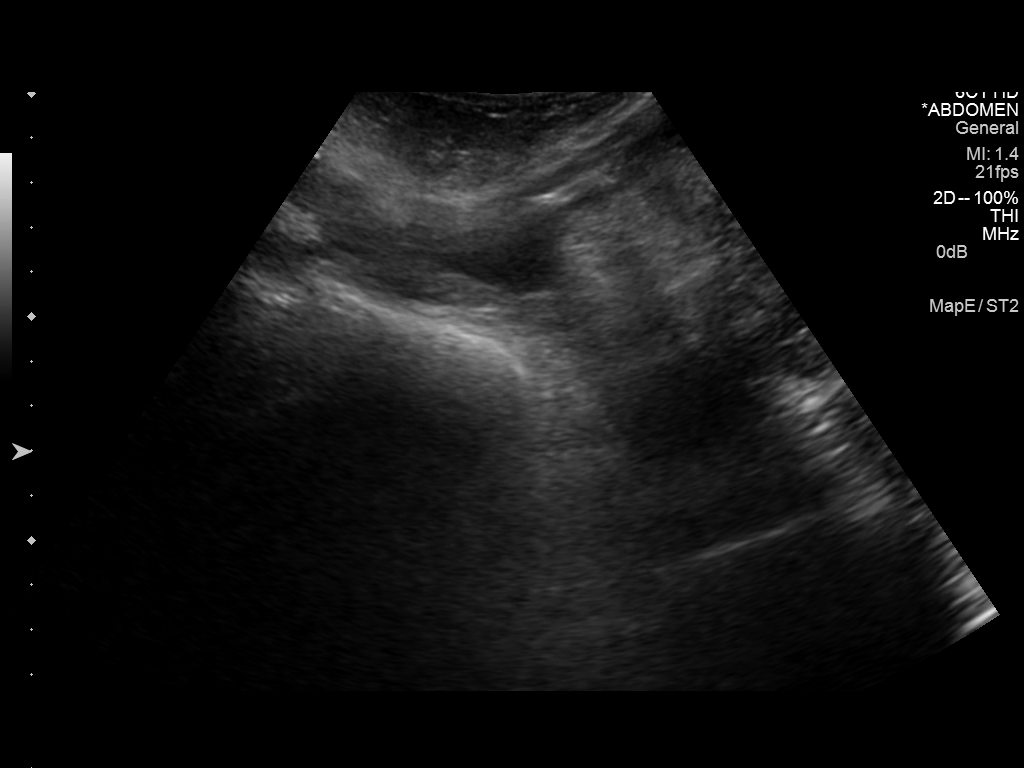
[im 7/7]
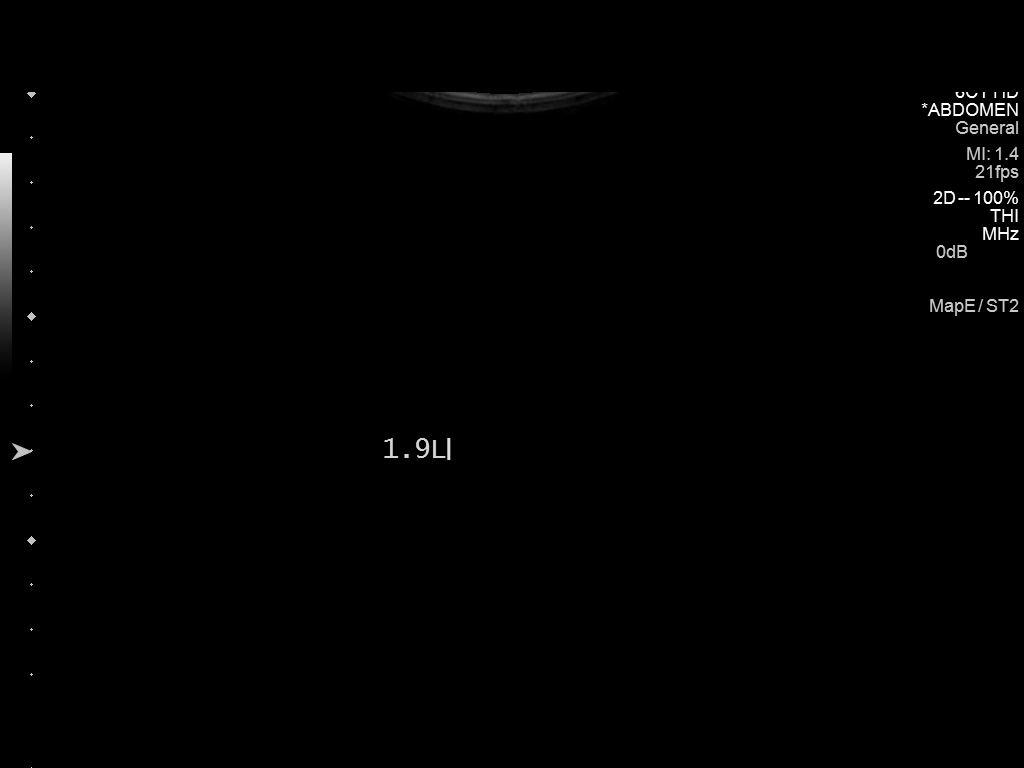

[7 of 7 positions shown; findings below may reference images not displayed]

Initial ultrasound scanning demonstrates a large amount of ascites
within the right lower abdominal quadrant. The right lower abdomen
was prepped and draped in the usual sterile fashion. 1% lidocaine
with epinephrine was used for local anesthesia. Under direct
ultrasound guidance, a 19 gauge, 7-cm, Yueh catheter was introduced.
An ultrasound image was saved for documentation purposed. The
paracentesis was performed. The catheter was removed and a dressing
was applied. The patient tolerated the procedure well without
immediate post procedural complication.
FINDINGS: A total of approximately 1.9 liters of yellow fluid was removed.
IMPRESSION: Successful ultrasound-guided paracentesis yielding 1.9 liters of
peritoneal fluid.

Read by:  Ioana Casteel

## 2016-10-28 IMAGING — CT CT ABD-PELV W/ CM
2 of 5 series · 15 of 46 positions shown, 17 images · IV contrast (omnipaque)
Comparison: 06/06/2013, 08/16/2014

CLINICAL DATA: Severe abdominal pain and nausea since [DATE] higher
hours on 09/21/2014, no relief with medications at home, no bowel
movement for 3 weeks, history of carcinoma the appendix June 2013
post chemotherapy, prior paracentesis

EXAM:
CT ABDOMEN AND PELVIS WITH CONTRAST
TECHNIQUE: Multidetector CT imaging of the abdomen and pelvis was performed
using the standard protocol following bolus administration of
intravenous contrast. Sagittal and coronal MPR images reconstructed
from axial data set.
CONTRAST:  80mL OMNIPAQUE IOHEXOL 300 MG/ML SOLN IV. Dilute oral
contrast.

[Series 2: abd/pel with · axial · 0.74mm/px · z∈[-370,+15]mm · 12 of 89 slices shown, 14 images]
[im 6/89  soft-tissue]
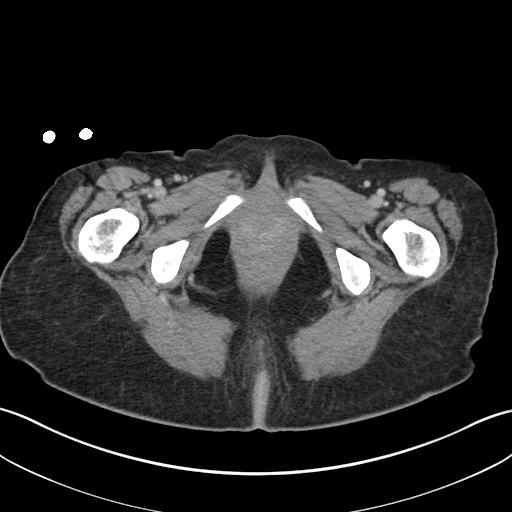
[im 6/89  bone]
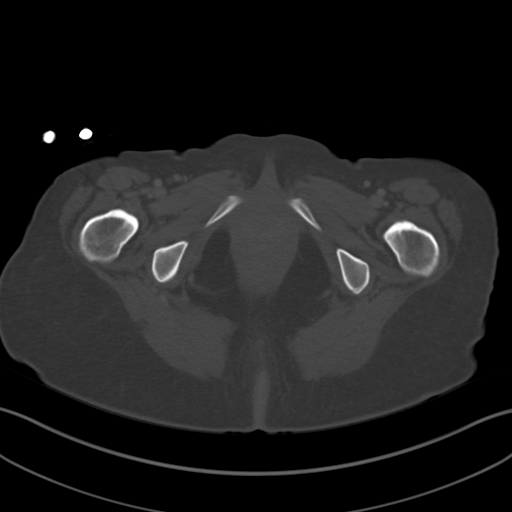
[im 16/89  soft-tissue]
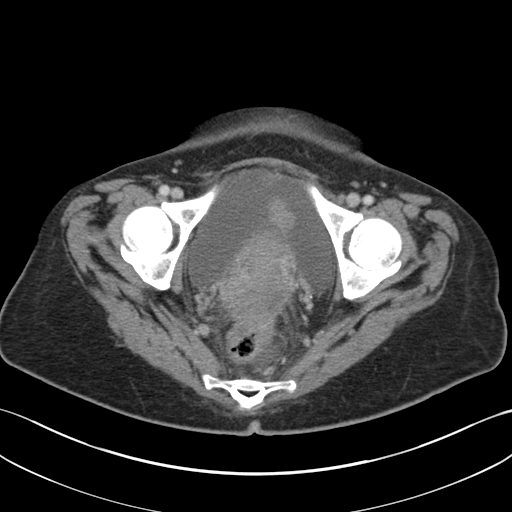
[im 21/89  soft-tissue]
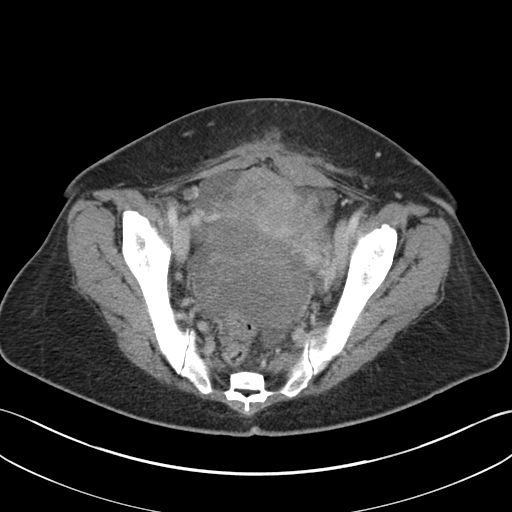
[im 26/89  soft-tissue]
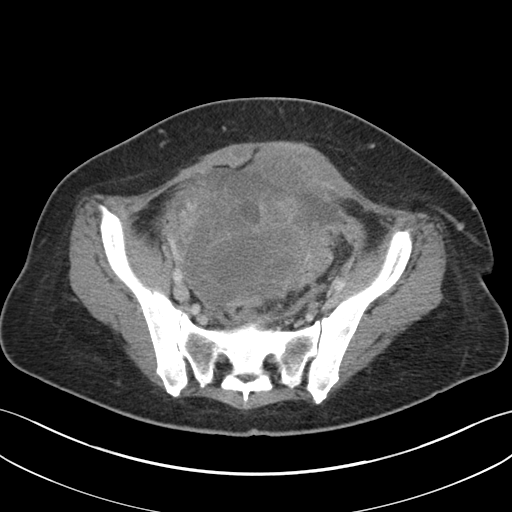
[im 37/89  soft-tissue]
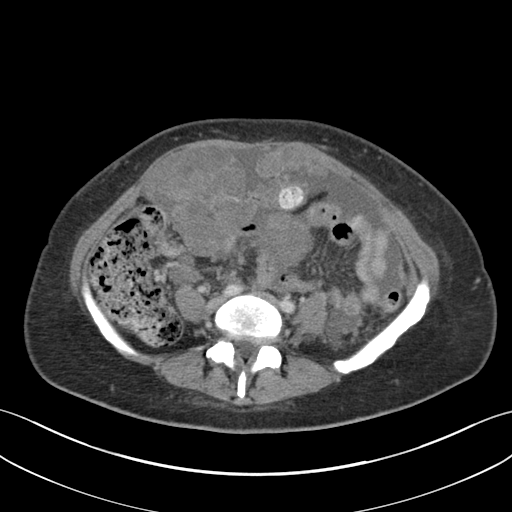
[im 42/89  soft-tissue]
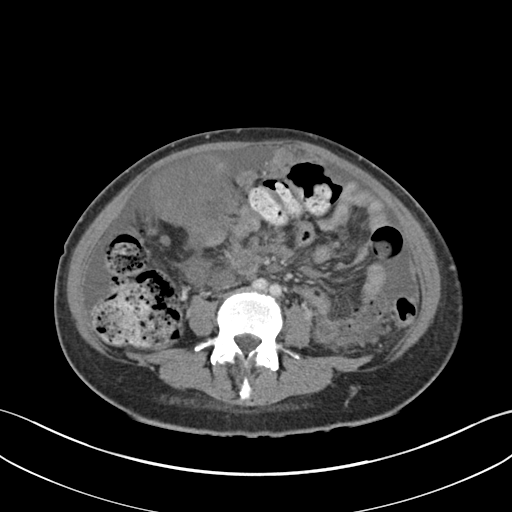
[im 47/89  soft-tissue]
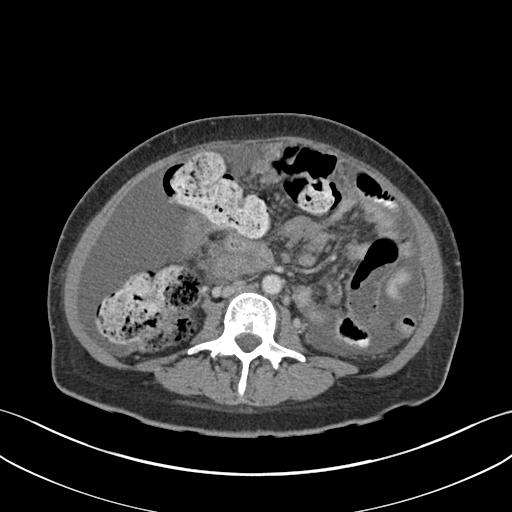
[im 57/89  soft-tissue]
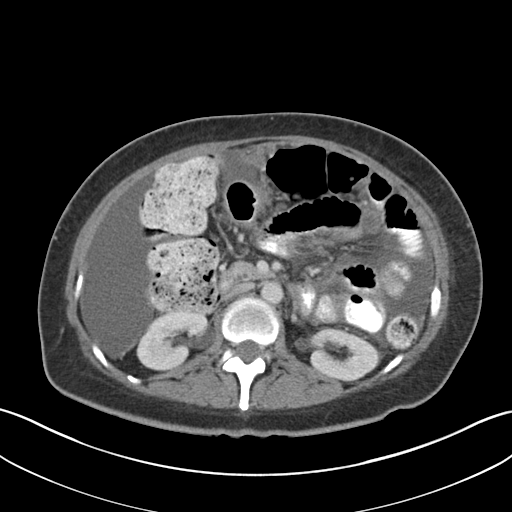
[im 63/89  soft-tissue]
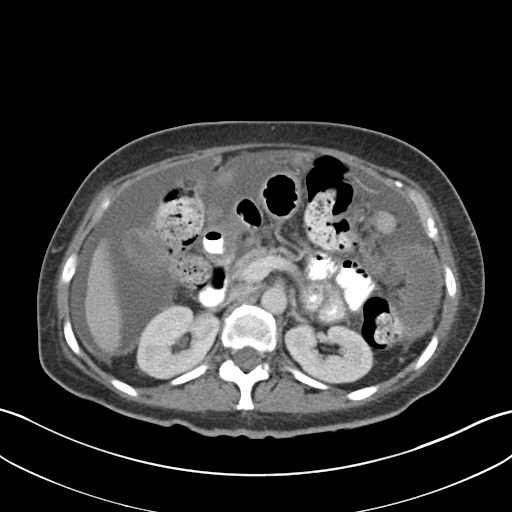
[im 63/89  bone]
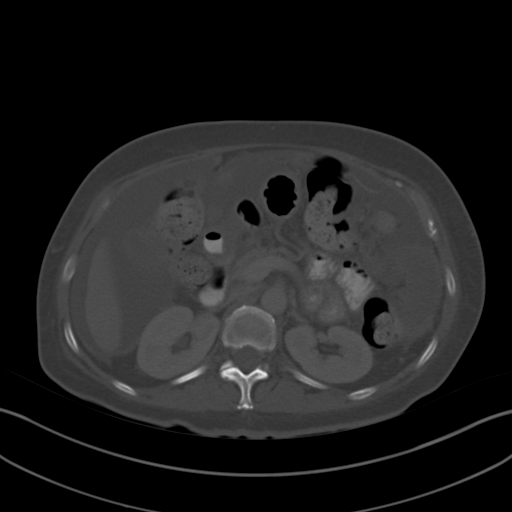
[im 68/89  soft-tissue]
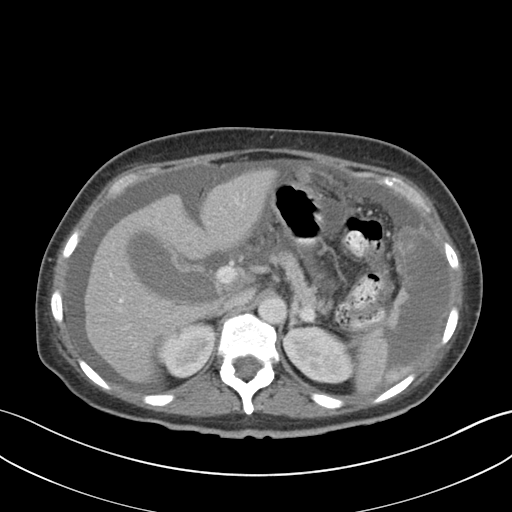
[im 78/89  soft-tissue]
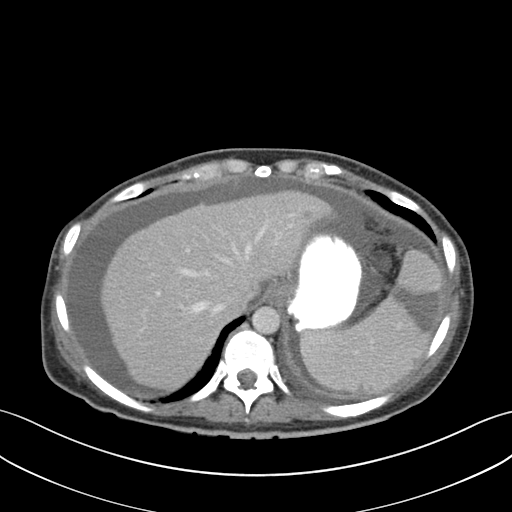
[im 83/89  soft-tissue]
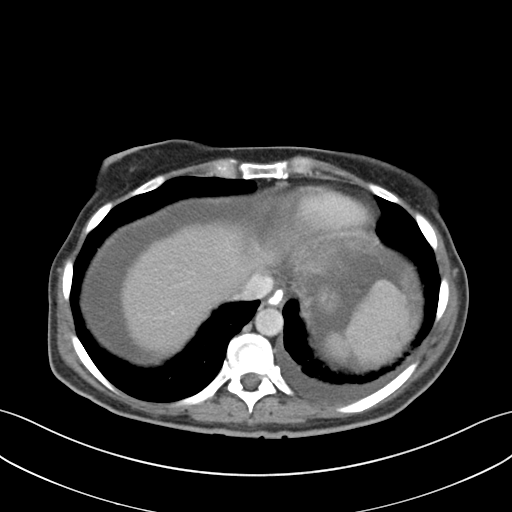

[Series 5: coronal a/|p · coronal · 0.68mm/px · 3 of 79 slices shown]
[im 27/79  soft-tissue]
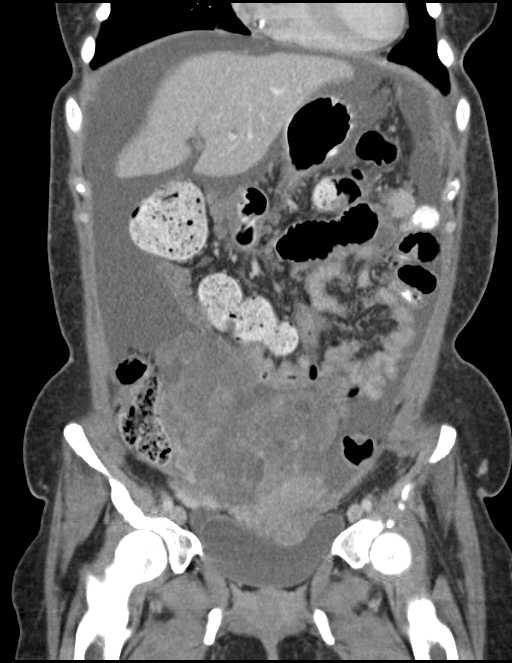
[im 35/79  soft-tissue]
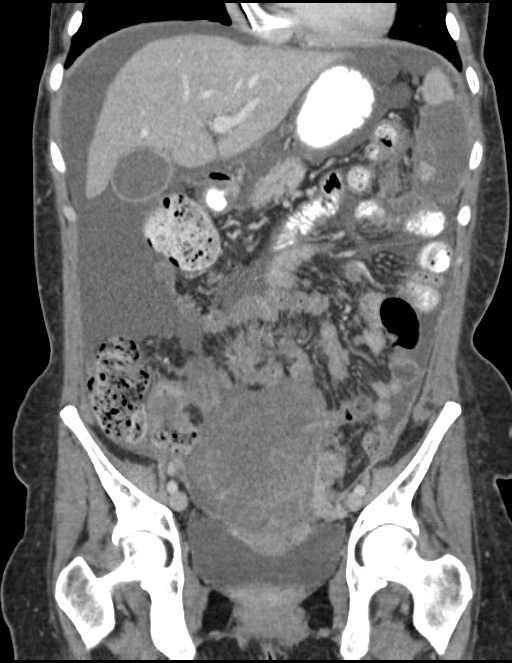
[im 44/79  soft-tissue]
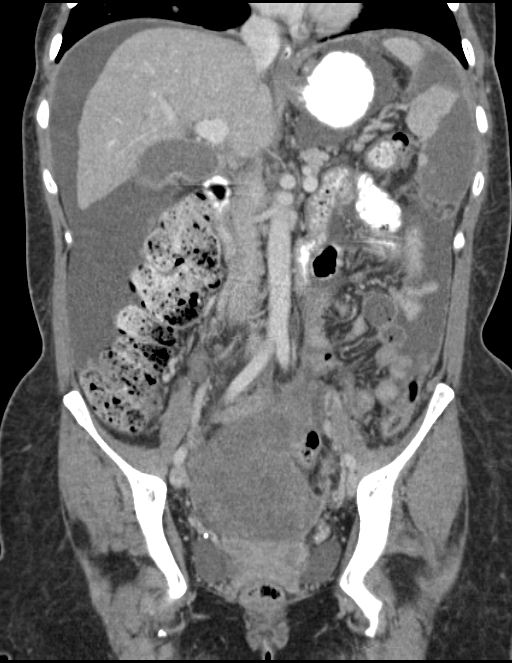

[15 of 46 positions shown; findings below may reference images not displayed]

FINDINGS: Small LEFT pleural effusion.

Numerous pulmonary nodules at the lung bases compatible with
multiple pulmonary metastases up to 9 mm diameter image 2.

Tip of Port-A-Cath in RIGHT atrium.

7 mm low-attenuation focus lateral segment LEFT lobe liver image 12.

Remainder of liver, pancreas, kidneys, and adrenal glands normal.

Large area of low attenuation at anterior/inferior aspect of spleen
favor splenic infarct over metaphysis unchanged from 08/16/2014.

Significant ascites with multiple soft tissue masses primarily in
the mid abdomen and pelvis compatible with peritoneal
carcinomatosis.

Conglomerate tumor mass in upper central pelvis measures 16.8 x
x 12.4 cm.

Soft tissue mass invades the rectus muscles of the anterior pelvis
bilaterally greater on LEFT, LEFT rectus mass measuring 7.5 x 3.6 x
5.7 cm.

Pelvic tumor masses contiguous with superior aspect of urinary
bladder cannot exclude bladder wall invasion.

Uterus and ovaries obscured by large pelvic mass.

Extensive mesenteric adenopathy and omental caking.

Stomach and bowel loops grossly unremarkable without evidence of
obstruction.

No free intraperitoneal air, hernia, or acute osseous findings.
IMPRESSION: Peritoneal carcinomatosis with extensive tumor throughout the
peritoneal cavity including a large 16.8 cm diameter mass in the
pelvis.

Associated omental caking, mesenteric adenopathy, and anterior
abdominal wall tumor extension.

Multiple pulmonary metastases and new small LEFT pleural effusion.

Question single metastasis at lateral segment LEFT lobe liver, new.

Large area of low attenuation within the anterior inferior spleen
favoring splenic infarct or sequela of prior intervention over
metastasis.

Cannot exclude bladder wall invasion by tumor at the superior margin
of the urinary bladder.

Findings called to Dr. Abdikarim on 09/22/2014 at 2482 hours.

## 2023-10-19 ENCOUNTER — Encounter: Payer: Self-pay | Admitting: Advanced Practice Midwife
